# Patient Record
Sex: Female | Born: 1965 | Race: Black or African American | Hispanic: No | Marital: Married | Smoking: Never smoker
Health system: Southern US, Community
[De-identification: ages and names within clinical notes are randomized; demographics above are authoritative.]

## PROBLEM LIST (undated history)

## (undated) DIAGNOSIS — R011 Cardiac murmur, unspecified: Secondary | ICD-10-CM

## (undated) DIAGNOSIS — N189 Chronic kidney disease, unspecified: Secondary | ICD-10-CM

## (undated) DIAGNOSIS — K5790 Diverticulosis of intestine, part unspecified, without perforation or abscess without bleeding: Secondary | ICD-10-CM

## (undated) DIAGNOSIS — Z800 Family history of malignant neoplasm of digestive organs: Secondary | ICD-10-CM

## (undated) DIAGNOSIS — E119 Type 2 diabetes mellitus without complications: Secondary | ICD-10-CM

## (undated) DIAGNOSIS — G43909 Migraine, unspecified, not intractable, without status migrainosus: Secondary | ICD-10-CM

## (undated) DIAGNOSIS — D649 Anemia, unspecified: Secondary | ICD-10-CM

## (undated) DIAGNOSIS — K2980 Duodenitis without bleeding: Secondary | ICD-10-CM

## (undated) DIAGNOSIS — I10 Essential (primary) hypertension: Secondary | ICD-10-CM

## (undated) DIAGNOSIS — N200 Calculus of kidney: Secondary | ICD-10-CM

## (undated) HISTORY — DX: Essential (primary) hypertension: I10

## (undated) HISTORY — DX: Diverticulosis of intestine, part unspecified, without perforation or abscess without bleeding: K57.90

## (undated) HISTORY — PX: TUBAL LIGATION: SHX77

## (undated) HISTORY — DX: Calculus of kidney: N20.0

## (undated) HISTORY — DX: Family history of malignant neoplasm of digestive organs: Z80.0

## (undated) HISTORY — DX: Anemia, unspecified: D64.9

## (undated) HISTORY — PX: KNEE ARTHROSCOPY W/ SYNOVECTOMY: SHX1887

## (undated) HISTORY — DX: Duodenitis without bleeding: K29.80

## (undated) HISTORY — PX: CHOLECYSTECTOMY: SHX55

---

## 1989-05-17 DIAGNOSIS — O24419 Gestational diabetes mellitus in pregnancy, unspecified control: Secondary | ICD-10-CM

## 2004-10-03 ENCOUNTER — Ambulatory Visit: Payer: Self-pay | Admitting: Orthopaedic Surgery

## 2006-05-20 ENCOUNTER — Ambulatory Visit: Payer: Self-pay | Admitting: Gastroenterology

## 2007-10-20 HISTORY — PX: LAPAROSCOPIC SUPRACERVICAL HYSTERECTOMY: SUR797

## 2008-07-04 ENCOUNTER — Ambulatory Visit: Payer: Self-pay | Admitting: Unknown Physician Specialty

## 2008-07-05 ENCOUNTER — Ambulatory Visit: Payer: Self-pay | Admitting: Unknown Physician Specialty

## 2012-10-31 ENCOUNTER — Other Ambulatory Visit: Payer: Self-pay | Admitting: Family Medicine

## 2012-10-31 LAB — CBC WITH DIFFERENTIAL/PLATELET
Basophil #: 0 10*3/uL (ref 0.0–0.1)
Eosinophil #: 0.1 10*3/uL (ref 0.0–0.7)
HCT: 40.5 % (ref 35.0–47.0)
HGB: 13.4 g/dL (ref 12.0–16.0)
Lymphocyte %: 45 %
MCH: 29.7 pg (ref 26.0–34.0)
MCHC: 33.2 g/dL (ref 32.0–36.0)
MCV: 90 fL (ref 80–100)
Monocyte %: 9.3 %
Neutrophil %: 42.2 %
RDW: 13.3 % (ref 11.5–14.5)
WBC: 4.4 10*3/uL (ref 3.6–11.0)

## 2012-10-31 LAB — COMPREHENSIVE METABOLIC PANEL
Albumin: 4.3 g/dL (ref 3.4–5.0)
Alkaline Phosphatase: 84 U/L (ref 50–136)
BUN: 12 mg/dL (ref 7–18)
EGFR (African American): >60
EGFR (Non-African Amer.): >60
Glucose: 122 mg/dL — ABNORMAL HIGH (ref 65–99)
Osmolality: 286 (ref 275–301)
Potassium: 3.8 mmol/L (ref 3.5–5.1)
SGOT(AST): 14 U/L — ABNORMAL LOW (ref 15–37)
SGPT (ALT): 17 U/L (ref 12–78)
Sodium: 143 mmol/L (ref 136–145)
Total Protein: 7.8 g/dL (ref 6.4–8.2)

## 2012-10-31 LAB — TSH: Thyroid Stimulating Horm: 1.43 u[IU]/mL

## 2012-10-31 LAB — LIPID PANEL
Cholesterol: 158 mg/dL (ref 0–200)
Ldl Cholesterol, Calc: 85 mg/dL (ref 0–100)
VLDL Cholesterol, Calc: 10 mg/dL (ref 5–40)

## 2013-08-29 ENCOUNTER — Ambulatory Visit: Payer: Self-pay | Admitting: Family Medicine

## 2014-03-19 LAB — BASIC METABOLIC PANEL
BUN: 11 mg/dL (ref 4–21)
CREATININE: 0.8 mg/dL (ref 0.5–1.1)
Glucose: 147 mg/dL
Potassium: 4.6 mmol/L (ref 3.4–5.3)
Sodium: 144 mmol/L (ref 137–147)

## 2014-03-19 LAB — HEPATIC FUNCTION PANEL
ALT: 12 U/L (ref 7–35)
AST: 11 U/L — AB (ref 13–35)
Alkaline Phosphatase: 71 U/L (ref 25–125)

## 2014-03-19 LAB — LIPID PANEL
Cholesterol: 194 mg/dL (ref 0–200)
HDL: 61 mg/dL (ref 35–70)
LDL CALC: 121 mg/dL
LDl/HDL Ratio: 2
Triglycerides: 60 mg/dL (ref 40–160)

## 2014-03-19 LAB — TSH: TSH: 1.04 u[IU]/mL (ref 0.41–5.90)

## 2014-03-19 LAB — CBC AND DIFFERENTIAL
HCT: 43 % (ref 36–46)
Hemoglobin: 14.9 g/dL (ref 12.0–16.0)
Neutrophils Absolute: 2 /uL
PLATELETS: 223 10*3/uL (ref 150–399)
WBC: 4.1 10*3/mL

## 2014-03-19 LAB — HEMOGLOBIN A1C: HEMOGLOBIN A1C: 7.9 % — AB (ref 4.0–6.0)

## 2015-03-25 DIAGNOSIS — E119 Type 2 diabetes mellitus without complications: Secondary | ICD-10-CM | POA: Insufficient documentation

## 2015-04-12 ENCOUNTER — Encounter: Payer: Self-pay | Admitting: *Deleted

## 2015-04-15 ENCOUNTER — Encounter
Admission: RE | Disposition: A | Discharge status: Home / Self Care | Payer: Self-pay | Source: Ambulatory Visit | Attending: Gastroenterology

## 2015-04-15 ENCOUNTER — Ambulatory Visit: Payer: Managed Care, Other (non HMO) | Admitting: Anesthesiology

## 2015-04-15 ENCOUNTER — Ambulatory Visit
Admission: RE | Admit: 2015-04-15 | Discharge: 2015-04-15 | Disposition: A | Discharge status: Home / Self Care | Payer: Managed Care, Other (non HMO) | Source: Ambulatory Visit | Attending: Gastroenterology | Admitting: Gastroenterology

## 2015-04-15 ENCOUNTER — Encounter: Payer: Self-pay | Admitting: *Deleted

## 2015-04-15 DIAGNOSIS — K5730 Diverticulosis of large intestine without perforation or abscess without bleeding: Secondary | ICD-10-CM | POA: Diagnosis not present

## 2015-04-15 DIAGNOSIS — Z880 Allergy status to penicillin: Secondary | ICD-10-CM | POA: Insufficient documentation

## 2015-04-15 DIAGNOSIS — Z79899 Other long term (current) drug therapy: Secondary | ICD-10-CM | POA: Insufficient documentation

## 2015-04-15 DIAGNOSIS — E119 Type 2 diabetes mellitus without complications: Secondary | ICD-10-CM | POA: Diagnosis not present

## 2015-04-15 DIAGNOSIS — R1013 Epigastric pain: Secondary | ICD-10-CM | POA: Insufficient documentation

## 2015-04-15 DIAGNOSIS — R1030 Lower abdominal pain, unspecified: Secondary | ICD-10-CM | POA: Diagnosis not present

## 2015-04-15 DIAGNOSIS — R109 Unspecified abdominal pain: Secondary | ICD-10-CM | POA: Diagnosis present

## 2015-04-15 DIAGNOSIS — N189 Chronic kidney disease, unspecified: Secondary | ICD-10-CM | POA: Insufficient documentation

## 2015-04-15 DIAGNOSIS — Z791 Long term (current) use of non-steroidal anti-inflammatories (NSAID): Secondary | ICD-10-CM | POA: Insufficient documentation

## 2015-04-15 DIAGNOSIS — Z882 Allergy status to sulfonamides status: Secondary | ICD-10-CM | POA: Diagnosis not present

## 2015-04-15 DIAGNOSIS — K2980 Duodenitis without bleeding: Secondary | ICD-10-CM | POA: Insufficient documentation

## 2015-04-15 HISTORY — DX: Migraine, unspecified, not intractable, without status migrainosus: G43.909

## 2015-04-15 HISTORY — DX: Cardiac murmur, unspecified: R01.1

## 2015-04-15 HISTORY — PX: ESOPHAGOGASTRODUODENOSCOPY: SHX5428

## 2015-04-15 HISTORY — PX: COLONOSCOPY WITH PROPOFOL: SHX5780

## 2015-04-15 HISTORY — DX: Type 2 diabetes mellitus without complications: E11.9

## 2015-04-15 HISTORY — DX: Chronic kidney disease, unspecified: N18.9

## 2015-04-15 LAB — GLUCOSE, CAPILLARY: GLUCOSE-CAPILLARY: 124 mg/dL — AB (ref 65–99)

## 2015-04-15 SURGERY — COLONOSCOPY WITH PROPOFOL
Anesthesia: General

## 2015-04-15 MED ORDER — PROPOFOL 10 MG/ML IV BOLUS
INTRAVENOUS | Status: DC | PRN
  Administered 2015-04-15: 50 mg via INTRAVENOUS

## 2015-04-15 MED ORDER — MIDAZOLAM HCL 5 MG/5ML IJ SOLN
INTRAMUSCULAR | Status: DC | PRN
  Administered 2015-04-15: 1 mg via INTRAVENOUS

## 2015-04-15 MED ORDER — SODIUM CHLORIDE 0.9 % IV SOLN
INTRAVENOUS | Status: DC | PRN
  Administered 2015-04-15: 11:00:00 via INTRAVENOUS

## 2015-04-15 MED ORDER — LIDOCAINE HCL (CARDIAC) 20 MG/ML IV SOLN
INTRAVENOUS | Status: DC | PRN
  Administered 2015-04-15: 30 mg via INTRAVENOUS

## 2015-04-15 MED ORDER — SODIUM CHLORIDE 0.9 % IV SOLN
INTRAVENOUS | Status: DC
  Administered 2015-04-15: 11:00:00 via INTRAVENOUS

## 2015-04-15 MED ORDER — FENTANYL CITRATE (PF) 100 MCG/2ML IJ SOLN
INTRAMUSCULAR | Status: DC | PRN
  Administered 2015-04-15: 50 ug via INTRAVENOUS

## 2015-04-15 MED ORDER — PROPOFOL INFUSION 10 MG/ML OPTIME
INTRAVENOUS | Status: DC | PRN
  Administered 2015-04-15: 140 ug/kg/min via INTRAVENOUS

## 2015-04-15 NOTE — Op Note (Signed)
[facility] Gastroenterology Patient Name: Jacqueline Romero Procedure Date: 04/15/2015 11:03 AM MRN: [id number] Account #: [id number] Date of Birth: January 06, 1966 Admit Type: Outpatient Age: 49 Room: [facility] ENDO ROOM 2 Gender: Female Note Status: Finalized Procedure:         Upper GI endoscopy Indications:       Dyspepsia Providers:         Christena Deem, MD Referring MD:      Ferdinand Lango. Sullivan Lone, MD (Referring MD) Medicines:         Monitored Anesthesia Care Complications:     No immediate complications. Procedure:         Pre-Anesthesia Assessment:                    - ASA Grade Assessment: II - A patient with mild systemic                     disease.                    After obtaining informed consent, the endoscope was passed                     under direct vision. Throughout the procedure, the                     patient's blood pressure, pulse, and oxygen saturations                     were monitored continuously. The Olympus GIF-160 endoscope                     (S#. [id number]) was introduced through the mouth, and                     advanced to the third part of duodenum. The upper GI                     endoscopy was accomplished without difficulty. Findings:      The Z-line was variable. Biopsies were taken with a cold forceps for       histology.      The exam of the esophagus was otherwise normal.      The entire examined stomach was normal.      The cardia and gastric fundus were normal on retroflexion.      Patchy mild inflammation characterized by erythema and granularity was       found in the duodenal bulb. 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 Impression:        - Z-line variable. Biopsied.                    - Normal stomach.                    - Duodenitis. Recommendation:    - Use Protonix (pantoprazole) 40 mg PO daily daily. Procedure Code(s): --- Professional ---                    [phone],  Esophagogastroduodenoscopy, flexible, transoral;                     with biopsy, single or multiple Diagnosis Code(s): --- Professional ---  530.89, Other specified disorders of esophagus                    535.60, Duodenitis, without mention of hemorrhage                    536.8, Dyspepsia and other specified disorders of function                     of stomach CPT copyright 2014 American [employer] Association. All rights reserved. The codes documented in this report are preliminary and upon coder review may  be revised to meet current compliance requirements. Christena DeemMartin U Keasia Dubose, MD 04/15/2015 11:48:27 AM This report has been signed electronically. Number of Addenda: 0 Note Initiated On: 04/15/2015 11:03 AM      [facility]

## 2015-04-15 NOTE — Op Note (Signed)
[facility] Gastroenterology Patient Name: Jacqueline Romero Procedure Date: 04/15/2015 11:02 AM MRN: [id number] Account #: [id number] Date of Birth: 1966-07-13 Admit Type: Outpatient Age: 49 Room: [facility] ENDO ROOM 2 Gender: Female Note Status: Finalized Procedure:         Colonoscopy Indications:       Lower abdominal pain Providers:         Christena Deem, MD Referring MD:      Ferdinand Lango. Sullivan Lone, MD (Referring MD) Medicines:         Monitored Anesthesia Care Complications:     No immediate complications. Procedure:         Pre-Anesthesia Assessment:                    - ASA Grade Assessment: II - A patient with mild systemic                     disease.                    After obtaining informed consent, the colonoscope was                     passed under direct vision. Throughout the procedure, the                     patient's blood pressure, pulse, and oxygen saturations                     were monitored continuously. The Olympus PCF-160AL                     colonoscope (S#. [id number]) was introduced through the anus                     and advanced to the the cecum, identified by appendiceal                     orifice and ileocecal valve. The colonoscopy was performed                     without difficulty. The patient tolerated the procedure                     well. The quality of the bowel preparation was good. Findings:      A few small-mouthed diverticula were found in the sigmoid colon.      The retroflexed view of the distal rectum and anal verge was normal and       showed no anal or rectal abnormalities.      The digital rectal exam was normal. Impression:        - Diverticulosis in the sigmoid colon.                    - The distal rectum and anal verge are normal on                     retroflexion view.                    - No specimens collected. Recommendation:    - Continue present medications. Procedure Code(s): --- Professional  ---                    [phone], Colonoscopy, flexible; diagnostic, including  collection of specimen(s) by brushing or washing, when                     performed (separate procedure) Diagnosis Code(s): --- Professional ---                    789.09, Abdominal pain, other specified site                    562.10, Diverticulosis of colon (without mention of                     hemorrhage) CPT copyright 2014 American Medical Association. All rights reserved. The codes documented in this report are preliminary and upon coder review may  be revised to meet current compliance requirements. Christena Deem, MD 04/15/2015 12:04:40 PM This report has been signed electronically. Number of Addenda: 0 Note Initiated On: 04/15/2015 11:02 AM Scope Withdrawal Time: 0 hours 0 minutes 1 second  Total Procedure Duration: 0 hours 4 minutes 41 seconds       [facility]

## 2015-04-15 NOTE — H&P (Signed)
Outpatient short stay form Pre-procedure 04/15/2015 11:15 AM Christena Deem MD  Primary Physician: Dr. Julieanne Manson  Reason for visit:  Abdominal pain and dyspepsia  History of present illness:  Patient is a 49 year old female presenting with complaint of some epigastric abdominal pain. This also seems to be periumbilical at times and toward the lower abdomen. It is increased with eating and having a bowel movement. He does take meloxicam at bedtime without food. She occasionally will take a powdered aspirin product. She was only recently started on 20 mg of omeprazole which has been of some benefit. He tolerated her prep well. Takes no anticoagulates.    Current facility-administered medications:  .  0.9 %  sodium chloride infusion, , Intravenous, Continuous, Christena Deem, MD, Last Rate: 20 mL/hr at 04/15/15 1113  Facility-Administered Medications Ordered in Other Encounters:  .  0.9 %  sodium chloride infusion, , Intravenous, Continuous PRN, Charna Busman, CRNA  Prescriptions prior to admission  Medication Sig Dispense Refill Last Dose  . Cholecalciferol (VITAMIN D) 2000 UNITS CAPS Take 2,000 Units by mouth daily.     . ferrous fumarate (HEMOCYTE - 106 MG FE) 325 (106 FE) MG TABS tablet Take 1 tablet by mouth daily.   Past Week at Unknown time  . losartan (COZAAR) 25 MG tablet Take 25 mg by mouth daily.     . meloxicam (MOBIC) 15 MG tablet Take 15 mg by mouth daily.     . metFORMIN (GLUCOPHAGE) 1000 MG tablet Take 1,000 mg by mouth 2 (two) times daily with a meal.     . omeprazole (PRILOSEC OTC) 20 MG tablet Take 20 mg by mouth daily.     . pravastatin (PRAVACHOL) 10 MG tablet Take 10 mg by mouth daily.        Allergies  Allergen Reactions  . Penicillins Hives and Swelling  . Sulfur Hives     Past Medical History  Diagnosis Date  . Diabetes mellitus without complication   . Migraine   . Heart murmur   . Chronic kidney disease     Review of systems:       Physical Exam    Heart and lungs: Regular rate and rhythm without rub or gallop lungs are bilaterally clear    HEENT: Normocephalic atraumatic eyes are anicteric    Other:     Pertinant exam for procedure: Soft mild tenderness to palpation the epigastric region extending toward the umbilicus. There are no masses rebound or organomegaly. Bowel sounds positive normoactive. She is not distended.    Planned proceedures: EGD and colonoscopy. I have discussed the risks benefits and complications of procedures to include not limited to bleeding, infection, perforation and the risk of sedation and the patient wishes to proceed.    Christena Deem, MD Gastroenterology 04/15/2015  11:15 AM

## 2015-04-15 NOTE — Anesthesia Preprocedure Evaluation (Signed)
Anesthesia Evaluation  Patient identified by MRN, date of birth, ID band Patient awake    Reviewed: Allergy & Precautions, H&P , NPO status , Patient's Chart, lab work & pertinent test results, reviewed documented beta blocker date and time   Airway Mallampati: II  TM Distance: >3 FB Neck ROM: full    Dental no notable dental hx.    Pulmonary neg pulmonary ROS,  breath sounds clear to auscultation  Pulmonary exam normal       Cardiovascular Exercise Tolerance: Good negative cardio ROS  + Valvular Problems/Murmurs Rhythm:regular Rate:Normal     Neuro/Psych  Headaches, negative neurological ROS  negative psych ROS   GI/Hepatic negative GI ROS, Neg liver ROS,   Endo/Other  negative endocrine ROSdiabetes  Renal/GU Renal diseasenegative Renal ROS  negative genitourinary   Musculoskeletal   Abdominal   Peds  Hematology negative hematology ROS (+)   Anesthesia Other Findings   Reproductive/Obstetrics negative OB ROS                             Anesthesia Physical Anesthesia Plan  ASA: II  Anesthesia Plan: General   Post-op Pain Management:    Induction:   Airway Management Planned:   Additional Equipment:   Intra-op Plan:   Post-operative Plan:   Informed Consent: I have reviewed the patients History and Physical, chart, labs and discussed the procedure including the risks, benefits and alternatives for the proposed anesthesia with the patient or authorized representative who has indicated his/her understanding and acceptance.   Dental Advisory Given  Plan Discussed with: CRNA  Anesthesia Plan Comments:         Anesthesia Quick Evaluation

## 2015-04-15 NOTE — Transfer of Care (Signed)
Immediate Anesthesia Transfer of Care Note  Patient: Jacqueline Romero T Bolding  Procedure(s) Performed: Procedure(s): COLONOSCOPY WITH PROPOFOL (N/A) ESOPHAGOGASTRODUODENOSCOPY (EGD) (N/A)  Patient Location: PACU, Short Stay and Endoscopy Unit  Anesthesia Type:General  Level of Consciousness: awake and patient cooperative  Airway & Oxygen Therapy: Patient Spontanous Breathing and Patient connected to nasal cannula oxygen  Post-op Assessment: Report given to RN and Post -op Vital signs reviewed and stable  Post vital signs: Reviewed and stable  Last Vitals:  Filed Vitals:   04/15/15 1210  BP:   Pulse:   Temp: 36.1 C  Resp:     Complications: No apparent anesthesia complications

## 2015-04-16 LAB — SURGICAL PATHOLOGY

## 2015-04-16 NOTE — Anesthesia Postprocedure Evaluation (Signed)
  Anesthesia Post-op Note  Patient: Jacqueline Romero  Procedure(s) Performed: Procedure(s): COLONOSCOPY WITH PROPOFOL (N/A) ESOPHAGOGASTRODUODENOSCOPY (EGD) (N/A)  Anesthesia type:General  Patient location: PACU  Post pain: Pain level controlled  Post assessment: Post-op Vital signs reviewed, Patient's Cardiovascular Status Stable, Respiratory Function Stable, Patent Airway and No signs of Nausea or vomiting  Post vital signs: Reviewed and stable  Last Vitals:  Filed Vitals:   04/15/15 1240  BP: 133/77  Pulse:   Temp:   Resp: 14    Level of consciousness: awake, alert  and patient cooperative  Complications: No apparent anesthesia complications

## 2015-04-21 ENCOUNTER — Other Ambulatory Visit: Payer: Self-pay | Admitting: Family Medicine

## 2015-06-19 DIAGNOSIS — E559 Vitamin D deficiency, unspecified: Secondary | ICD-10-CM | POA: Insufficient documentation

## 2015-06-19 DIAGNOSIS — E1169 Type 2 diabetes mellitus with other specified complication: Secondary | ICD-10-CM | POA: Insufficient documentation

## 2015-06-19 DIAGNOSIS — K219 Gastro-esophageal reflux disease without esophagitis: Secondary | ICD-10-CM | POA: Insufficient documentation

## 2015-06-19 DIAGNOSIS — E669 Obesity, unspecified: Secondary | ICD-10-CM

## 2015-06-19 DIAGNOSIS — E785 Hyperlipidemia, unspecified: Secondary | ICD-10-CM | POA: Insufficient documentation

## 2015-06-20 ENCOUNTER — Encounter: Payer: Self-pay | Admitting: Family Medicine

## 2015-07-04 ENCOUNTER — Ambulatory Visit (INDEPENDENT_AMBULATORY_CARE_PROVIDER_SITE_OTHER): Payer: Managed Care, Other (non HMO) | Admitting: Family Medicine

## 2015-07-04 ENCOUNTER — Encounter: Payer: Self-pay | Admitting: Family Medicine

## 2015-07-04 VITALS — BP 118/78 | HR 72 | Temp 98.3°F | Resp 16 | Ht 65.0 in | Wt 178.2 lb

## 2015-07-04 DIAGNOSIS — M25511 Pain in right shoulder: Secondary | ICD-10-CM | POA: Diagnosis not present

## 2015-07-04 DIAGNOSIS — Z0000 Encounter for general adult medical examination without abnormal findings: Secondary | ICD-10-CM | POA: Diagnosis not present

## 2015-07-04 DIAGNOSIS — E119 Type 2 diabetes mellitus without complications: Secondary | ICD-10-CM

## 2015-07-04 NOTE — Progress Notes (Signed)
Patient ID: Jacqueline Romero, female   DOB: 08/10/1966, 49 y.o.   MRN: [id number]       Patient: Jacqueline Romero, Female    DOB: [birth year], 49 y.o.   MRN: [id number] Visit Date: 07/04/2015  Today's Provider: [name], MD   Chief Complaint  Patient presents with  . Annual Exam    annual exam a year ago.   Subjective:    Annual physical exam [name] is a 49 y.o. female who presents today for health maintenance and complete physical. She feels well. She reports exercising walk twice a week.. She reports she is sleeping fairly well.  Pap - Dr. Harvest Dark Mammo 03/2015 Dr. Harvest Dark normal per pt Tdap 01/136/2014 EKG 01/18/2007 Colon with Endo 06/27/206 Diverticulosis otherwise normal - Dr. Marva Panda Eye exam 02/2015  -----------------------------------------------------------------   Review of Systems  Constitutional: Negative.   HENT: Positive for sore throat.   Eyes: Negative.   Respiratory: Negative.   Cardiovascular: Negative.   Gastrointestinal: Positive for abdominal pain and constipation.  Endocrine: Negative.   Genitourinary: Negative.   Musculoskeletal: Negative.   Allergic/Immunologic: Negative.   Neurological: Negative.   Hematological: Negative.   Psychiatric/Behavioral: Negative.     Social History She  reports that she has never smoked. She does not have any smokeless tobacco history on file. She reports that she does not drink alcohol or use illicit drugs. Social History   Social History  . Marital Status: Married    Spouse Name: N/A  . Number of Children: N/A  . Years of Education: N/A   Social History Main Topics  . Smoking status: Never Smoker   . Smokeless tobacco: None  . Alcohol Use: No  . Drug Use: No  . Sexual Activity: Not Asked   Other Topics Concern  . None   Social History Narrative    Patient Active Problem List   Diagnosis Date Noted  . Vitamin D deficiency 06/19/2015  . Obesity 06/19/2015  .  Hyperlipidemia 06/19/2015  . GERD (gastroesophageal reflux disease) 06/19/2015  . Diabetes mellitus, type 2 03/25/2015    Past Surgical History  Procedure Laterality Date  . Cholecystectomy    . Tubal ligation Bilateral   . Knee arthroscopy w/ synovectomy Left   . Esophagogastroduodenoscopy    . Colonoscopy with propofol N/A 04/15/2015    Procedure: COLONOSCOPY WITH PROPOFOL;  Surgeon: Christena Deem, MD;  Location: [facility] ENDOSCOPY;  Service: Endoscopy;  Laterality: N/A;  . Esophagogastroduodenoscopy N/A 04/15/2015    Procedure: ESOPHAGOGASTRODUODENOSCOPY (EGD);  Surgeon: Christena Deem, MD;  Location: [facility] ENDOSCOPY;  Service: Endoscopy;  Laterality: N/A;  . Abdominal hysterectomy      Family History  Family Status  Relation Status Death Age  . Mother Deceased 75  . Father Deceased 70  . Sister Alive   . Brother Alive    Her family history includes Diabetes in her maternal aunt, maternal uncle, and sister; Hypertension in her brother, father, maternal aunt, maternal uncle, and sister; Pancreatic cancer in her mother; Throat cancer in her father.    Allergies  Allergen Reactions  . Penicillins Hives and Swelling  . Sulfur Hives    Previous Medications   CHOLECALCIFEROL (VITAMIN D) 2000 UNITS CAPS    Take 2,000 Units by mouth daily.   FERROUS FUMARATE (HEMOCYTE - 106 MG FE) 325 (106 FE) MG TABS TABLET    Take 1 tablet by mouth daily.   LOSARTAN (COZAAR) 25 MG TABLET    Take 25 mg  by mouth daily.   MELOXICAM (MOBIC) 15 MG TABLET    Take 15 mg by mouth daily.   METFORMIN (GLUCOPHAGE) 1000 MG TABLET    TAKE ONE TABLET BY MOUTH TWICE DAILY   OMEPRAZOLE (PRILOSEC OTC) 20 MG TABLET    Take 20 mg by mouth daily.   PANTOPRAZOLE (PROTONIX) 20 MG TABLET    Take 20 mg by mouth 2 (two) times daily.   PRAVASTATIN (PRAVACHOL) 10 MG TABLET    Take 10 mg by mouth daily.   PSYLLIUM (METAMUCIL) 48.57 % POWD    Take by mouth.    Patient Care Team: Maple Hudson., MD as PCP -  General (Family Medicine)     Objective:   Vitals: BP 118/78 mmHg  Pulse 72  Temp(Src) 98.3 F (36.8 C) (Oral)  Resp 16  Ht  (1.651 m)  Wt 178 lb 3.2 oz (80.831 kg)  BMI 29.65 kg/m2   Physical Exam  Constitutional: She is oriented to person, place, and time. She appears well-developed and well-nourished.  HENT:  Head: Normocephalic and atraumatic.  Right Ear: External ear normal.  Left Ear: External ear normal.  Nose: Nose normal.  Eyes: Conjunctivae and EOM are normal. Pupils are equal, round, and reactive to light.  Neck: Neck supple.  Cardiovascular: Normal rate, regular rhythm and normal heart sounds.   Pulmonary/Chest: Effort normal and breath sounds normal.  Abdominal: Soft.  Musculoskeletal: Normal range of motion.  Neurological: She is alert and oriented to person, place, and time.  Skin: Skin is warm and dry.  Psychiatric: She has a normal mood and affect. Her behavior is normal. Judgment and thought content normal.     Depression Screen No flowsheet data found.    Assessment & Plan:     Routine Health Maintenance and Physical Exam  Exercise Activities and Dietary recommendations Goals    None      Immunization History  Administered Date(s) Administered  . Hepatitis B 05/25/2013, 06/30/2013  . Td 07/27/1997, 10/31/2012    Health Maintenance  Topic Date Due  . PNEUMOCOCCAL POLYSACCHARIDE VACCINE (1) 09/30/1967  . FOOT EXAM  09/30/1975  . OPHTHALMOLOGY EXAM  09/30/1975  . URINE MICROALBUMIN  09/30/1975  . HIV Screening  09/29/1980  . PAP SMEAR  09/29/1986  . HEMOGLOBIN A1C  09/18/2014  . INFLUENZA VACCINE  05/20/2015  . TETANUS/TDAP  10/31/2022      Discussed health benefits of physical activity, and encouraged her to engage in regular exercise appropriate for her age and condition. Regular diet and exercise discussed at length.  gynecologic care per Dr. Arvil Chaco. --------------------------------------------------------------------

## 2015-07-11 ENCOUNTER — Encounter: Payer: Self-pay | Admitting: Family Medicine

## 2015-10-24 ENCOUNTER — Other Ambulatory Visit: Payer: Self-pay

## 2016-01-03 ENCOUNTER — Other Ambulatory Visit: Payer: Self-pay | Admitting: Family Medicine

## 2016-03-18 ENCOUNTER — Encounter: Payer: Self-pay | Admitting: Family Medicine

## 2016-04-30 ENCOUNTER — Other Ambulatory Visit: Payer: Self-pay | Admitting: Family Medicine

## 2016-06-10 ENCOUNTER — Telehealth: Payer: Self-pay | Admitting: Family Medicine

## 2016-06-10 NOTE — Telephone Encounter (Signed)
Pt was scheduled for CPE on 06/30/16. When pt was contacted to reschedule appt the next available was 07/21/16 and pt stated she needs the appt before October because her work is requiring the appt prior to end of September. Can the pt be worked in for a CPE before October? Last CPE was 07/04/15. Please advise. Thanks TNP

## 2016-06-10 NOTE — Telephone Encounter (Signed)
There are some 3 pm appointments in September do you want to use those. Let me know=aa

## 2016-06-10 NOTE — Telephone Encounter (Signed)
I think Thursday, August 31, has some openings in the morning.

## 2016-06-10 NOTE — Telephone Encounter (Signed)
Her last one was 07/04/15 so she has to have it after that date but before October -aa

## 2016-06-11 NOTE — Telephone Encounter (Signed)
Spoke with pt and scheduled appt for 07/09/16 at 3pm. Pt was pleased to be worked in. Thanks TNP

## 2016-06-11 NOTE — Telephone Encounter (Signed)
Since we changed it because of my change in schedule how about 07/09/16  at 3:00pm?

## 2016-06-11 NOTE — Telephone Encounter (Signed)
Please schedule patient on that day as staettes per Dr Sullivan LoneGilbert, I advised patient on her voicemail, I am not able to add her at that time. Thank you-aa

## 2016-06-30 ENCOUNTER — Encounter: Payer: Managed Care, Other (non HMO) | Admitting: Family Medicine

## 2016-07-09 ENCOUNTER — Ambulatory Visit (INDEPENDENT_AMBULATORY_CARE_PROVIDER_SITE_OTHER): Payer: Managed Care, Other (non HMO) | Admitting: Family Medicine

## 2016-07-09 ENCOUNTER — Encounter: Payer: Self-pay | Admitting: Family Medicine

## 2016-07-09 VITALS — BP 122/72 | HR 72 | Temp 98.0°F | Resp 16 | Ht 66.0 in | Wt 175.0 lb

## 2016-07-09 DIAGNOSIS — E785 Hyperlipidemia, unspecified: Secondary | ICD-10-CM | POA: Diagnosis not present

## 2016-07-09 DIAGNOSIS — K219 Gastro-esophageal reflux disease without esophagitis: Secondary | ICD-10-CM

## 2016-07-09 DIAGNOSIS — E119 Type 2 diabetes mellitus without complications: Secondary | ICD-10-CM | POA: Diagnosis not present

## 2016-07-09 DIAGNOSIS — Z126 Encounter for screening for malignant neoplasm of bladder: Secondary | ICD-10-CM

## 2016-07-09 DIAGNOSIS — Z0000 Encounter for general adult medical examination without abnormal findings: Secondary | ICD-10-CM

## 2016-07-09 LAB — POCT URINALYSIS DIPSTICK
Bilirubin, UA: NEGATIVE
GLUCOSE UA: NEGATIVE
Leukocytes, UA: NEGATIVE
NITRITE UA: NEGATIVE
PH UA: 5
PROTEIN UA: NEGATIVE
RBC UA: NEGATIVE
Spec Grav, UA: >=1.03
UROBILINOGEN UA: 0.2

## 2016-07-09 MED ORDER — OMEPRAZOLE 20 MG PO CPDR: 20.0000 mg | DELAYED_RELEASE_CAPSULE | Freq: Every day | ORAL | 12 refills | Status: DC

## 2016-07-09 NOTE — Progress Notes (Signed)
Patient: Jacqueline Romero, Female    DOB: September 12, 1966, 50 y.o.   MRN: [id number] Visit Date: 07/09/2016  Today's Provider: [name], MD   Chief Complaint  Patient presents with  . Annual Exam   Subjective:    Annual physical exam [name] is a 50 y.o. female who presents today for health maintenance and complete physical. She feels well. She reports exercising 7 days a week; walks for about 15 minutes. She reports she is sleeping well.  ----------------------------------------------------------------- Sees Gynecology Last colonoscopy- 04/15/2015- Diverticulosis. Otherwise normal.  Review of Systems  HENT: Positive for sore throat.   Eyes: Negative.   Respiratory: Negative.   Cardiovascular: Negative.   Gastrointestinal: Positive for abdominal pain.  Endocrine: Negative.   Genitourinary: Negative.   Musculoskeletal: Negative.   Allergic/Immunologic: Negative.   Neurological: Negative.   Psychiatric/Behavioral: Negative.   All other systems reviewed and are negative.   Social History      She  reports that she has never smoked. She has never used smokeless tobacco. She reports that she does not drink alcohol or use drugs.       Social History   Social History  . Marital status: Married    Spouse name: Reuel Boom  . Number of children: 1  . Years of education: masters   Occupational History  .  [employer]   Social History Main Topics  . Smoking status: Never Smoker  . Smokeless tobacco: Never Used  . Alcohol use No  . Drug use: No  . Sexual activity: Not Asked   Other Topics Concern  . None   Social History Narrative  . None    Past Medical History:  Diagnosis Date  . Chronic kidney disease   . Diabetes mellitus without complication (HCC)   . Heart murmur   . Migraine      Patient Active Problem List   Diagnosis Date Noted  . Vitamin D deficiency 06/19/2015  . Obesity 06/19/2015  . Hyperlipidemia 06/19/2015  .  GERD (gastroesophageal reflux disease) 06/19/2015  . Diabetes mellitus, type 2 (HCC) 03/25/2015    Past Surgical History:  Procedure Laterality Date  . ABDOMINAL HYSTERECTOMY    . CHOLECYSTECTOMY    . COLONOSCOPY WITH PROPOFOL N/A 04/15/2015   Procedure: COLONOSCOPY WITH PROPOFOL;  Surgeon: Christena Deem, MD;  Location: [facility] ENDOSCOPY;  Service: Endoscopy;  Laterality: N/A;  . ESOPHAGOGASTRODUODENOSCOPY    . ESOPHAGOGASTRODUODENOSCOPY N/A 04/15/2015   Procedure: ESOPHAGOGASTRODUODENOSCOPY (EGD);  Surgeon: Christena Deem, MD;  Location: [facility] ENDOSCOPY;  Service: Endoscopy;  Laterality: N/A;  . KNEE ARTHROSCOPY W/ SYNOVECTOMY Left   . TUBAL LIGATION Bilateral     Family History        Family Status  Relation Status  . Mother Deceased at age 44  . Father Deceased at age 77  . Sister Alive  . Brother Alive  . Maternal Aunt   . Maternal Uncle         Her family history includes Diabetes in her maternal aunt, maternal uncle, and sister; Hypertension in her brother, father, maternal aunt, maternal uncle, and sister; Pancreatic cancer in her mother; Throat cancer in her father.    Allergies  Allergen Reactions  . Penicillins Hives and Swelling  . Sulfur Hives    Current Meds  Medication Sig  . Cholecalciferol (VITAMIN D) 2000 UNITS CAPS Take 2,000 Units by mouth daily.  . ferrous fumarate (HEMOCYTE - 106 MG FE) 325 (106  FE) MG TABS tablet Take 1 tablet by mouth daily.  Marland Kitchen losartan (COZAAR) 25 MG tablet TAKE ONE TABLET BY MOUTH ONCE DAILY  . metFORMIN (GLUCOPHAGE) 1000 MG tablet TAKE ONE TABLET BY MOUTH TWICE DAILY  . Psyllium (METAMUCIL) 48.57 % POWD Take by mouth.    Patient Care Team: Maple Hudson., MD as PCP - General (Family Medicine)     Objective:   Vitals: BP 122/72 (BP Location: Right Arm, Patient Position: Sitting, Cuff Size: Normal)   Pulse 72   Temp 98 F (36.7 C) (Oral)   Resp 16   Ht 5\' 6"  (1.676 m)   Wt 175 lb (79.4 kg)   BMI 28.25 kg/m     Physical Exam  Constitutional: She is oriented to person, place, and time. She appears well-developed and well-nourished.  HENT:  Head: Normocephalic and atraumatic.  Right Ear: Tympanic membrane, external ear and ear canal normal.  Left Ear: Tympanic membrane, external ear and ear canal normal.  Nose: Nose normal.  Mouth/Throat: Uvula is midline, oropharynx is clear and moist and mucous membranes are normal.  Eyes: Conjunctivae, EOM and lids are normal. Pupils are equal, round, and reactive to light.  Neck: Trachea normal and normal range of motion. Neck supple. Carotid bruit is not present. No thyroid mass and no thyromegaly present.  Cardiovascular: Normal rate, regular rhythm and normal heart sounds.   Pulmonary/Chest: Effort normal and breath sounds normal.  Abdominal: Soft. Normal appearance and bowel sounds are normal. There is no hepatosplenomegaly. There is no tenderness.  Musculoskeletal: Normal range of motion.  Lymphadenopathy:    She has no cervical adenopathy.    She has no axillary adenopathy.  Neurological: She is alert and oriented to person, place, and time. She has normal strength. No cranial nerve deficit.  Skin: Skin is warm, dry and intact.  Psychiatric: She has a normal mood and affect. Her speech is normal and behavior is normal. Judgment and thought content normal. Cognition and memory are normal.     Depression Screen PHQ 2/9 Scores 07/09/2016  PHQ - 2 Score 0      Assessment & Plan:     Routine Health Maintenance and Physical Exam  Exercise Activities and Dietary recommendations Goals    None      Immunization History  Administered Date(s) Administered  . Hepatitis B 05/25/2013, 06/30/2013  . Td 07/27/1997, 10/31/2012    Health Maintenance  Topic Date Due  . PNEUMOCOCCAL POLYSACCHARIDE VACCINE (1) 09/30/1967  . FOOT EXAM  09/30/1975  . OPHTHALMOLOGY EXAM  09/30/1975  . HIV Screening  09/29/1980  . PAP SMEAR  09/29/1986  . HEMOGLOBIN  A1C  09/18/2014  . INFLUENZA VACCINE  10/23/2016 (Originally 05/19/2016)  . MAMMOGRAM  02/24/2018  . TETANUS/TDAP  10/31/2022  . COLONOSCOPY  04/14/2025      Discussed health benefits of physical activity, and encouraged her to engage in regular exercise appropriate for her age and condition.    --------------------------------------------------------------------  1. Annual physical exam Stable. As above. Breasts exam, Pap, rectal exam per GYN. 2. Type 2 diabetes mellitus without complication, without long-term current use of insulin (HCC) FU pending results. - Hemoglobin A1c  3. Hyperlipidemia FU pending results. - CBC with Differential/Platelet - Comprehensive metabolic panel - Lipid panel - TSH  4. Gastroesophageal reflux disease, esophagitis presence not specified Worsening. Start Omeprazole as below. FU 2-3 month for abdominal pain. - omeprazole (PRILOSEC) 20 MG capsule; Take 1 capsule (20 mg total) by mouth daily.  Dispense: 30 capsule; Refill: 12  5. Bladder cancer screening Negative UA. Results for orders placed or performed in visit on 07/09/16  POCT urinalysis dipstick  Result Value Ref Range   Color, UA Amber    Clarity, UA Clear    Glucose, UA Negative    Bilirubin, UA Negative    Ketones, UA trace    Spec Grav, UA >=1.030    Blood, UA Negative    pH, UA 5.0    Protein, UA Negative    Urobilinogen, UA 0.2    Nitrite, UA Negative    Leukocytes, UA Negative Negative    - POCT urinalysis dipstick   Patient seen and examined by Julieanne Mansonichard Torsha Lemus, MD, and note scribed by Allene DillonEmily Drozdowski, CMA.  I have done the exam and reviewed the above chart and it is accurate to the best of my knowledge.  Kennya Schwenn Wendelyn BreslowGilbert Jr, MD  [facility]

## 2016-07-11 LAB — CBC WITH DIFFERENTIAL/PLATELET
BASOS: 1 %
Basophils Absolute: 0 10*3/uL (ref 0.0–0.2)
EOS (ABSOLUTE): 0.1 10*3/uL (ref 0.0–0.4)
Eos: 2 %
HEMATOCRIT: 41.8 % (ref 34.0–46.6)
HEMOGLOBIN: 14.3 g/dL (ref 11.1–15.9)
IMMATURE GRANULOCYTES: 0 %
Immature Grans (Abs): 0 10*3/uL (ref 0.0–0.1)
Lymphocytes Absolute: 1.9 10*3/uL (ref 0.7–3.1)
Lymphs: 48 %
MCH: 29.1 pg (ref 26.6–33.0)
MCHC: 34.2 g/dL (ref 31.5–35.7)
MCV: 85 fL (ref 79–97)
MONOCYTES: 9 %
Monocytes Absolute: 0.4 10*3/uL (ref 0.1–0.9)
NEUTROS PCT: 40 %
Neutrophils Absolute: 1.6 10*3/uL (ref 1.4–7.0)
Platelets: 233 10*3/uL (ref 150–379)
RBC: 4.91 x10E6/uL (ref 3.77–5.28)
RDW: 13.5 % (ref 12.3–15.4)
WBC: 4 10*3/uL (ref 3.4–10.8)

## 2016-07-11 LAB — TSH: TSH: 1.59 u[IU]/mL (ref 0.450–4.500)

## 2016-07-11 LAB — LIPID PANEL
CHOL/HDL RATIO: 3.1 ratio (ref 0.0–4.4)
Cholesterol, Total: 191 mg/dL (ref 100–199)
HDL: 62 mg/dL (ref >39)
LDL Calculated: 116 mg/dL — ABNORMAL HIGH (ref 0–99)
Triglycerides: 66 mg/dL (ref 0–149)
VLDL Cholesterol Cal: 13 mg/dL (ref 5–40)

## 2016-07-11 LAB — COMPREHENSIVE METABOLIC PANEL
A/G RATIO: 1.7 (ref 1.2–2.2)
ALBUMIN: 4.2 g/dL (ref 3.5–5.5)
ALT: 10 IU/L (ref 0–32)
AST: 13 IU/L (ref 0–40)
Alkaline Phosphatase: 70 IU/L (ref 39–117)
BUN / CREAT RATIO: 16 (ref 9–23)
BUN: 13 mg/dL (ref 6–24)
Bilirubin Total: 0.4 mg/dL (ref 0.0–1.2)
CALCIUM: 9.1 mg/dL (ref 8.7–10.2)
CO2: 23 mmol/L (ref 18–29)
Chloride: 103 mmol/L (ref 96–106)
Creatinine, Ser: 0.79 mg/dL (ref 0.57–1.00)
GFR calc Af Amer: 101 mL/min/{1.73_m2} (ref >59)
GFR, EST NON AFRICAN AMERICAN: 88 mL/min/{1.73_m2} (ref >59)
GLOBULIN, TOTAL: 2.5 g/dL (ref 1.5–4.5)
Glucose: 148 mg/dL — ABNORMAL HIGH (ref 65–99)
Potassium: 4.5 mmol/L (ref 3.5–5.2)
SODIUM: 143 mmol/L (ref 134–144)
Total Protein: 6.7 g/dL (ref 6.0–8.5)

## 2016-07-11 LAB — HEMOGLOBIN A1C
ESTIMATED AVERAGE GLUCOSE: 183 mg/dL
Hgb A1c MFr Bld: 8 % — ABNORMAL HIGH (ref 4.8–5.6)

## 2016-07-13 ENCOUNTER — Telehealth: Payer: Self-pay

## 2016-07-13 NOTE — Telephone Encounter (Signed)
Left message to call back  

## 2016-07-13 NOTE — Telephone Encounter (Signed)
-----   Message from Maple Hudsonichard L Gilbert Jr., MD sent at 07/11/2016  7:03 AM EDT ----- DM fair control. Guidelines suggest we might add med for DM or pt can check BS daily and follow D and E more strenuously. Consider Jardiance 10mg  q am if she feels she is doing all she can with D and E

## 2016-07-14 NOTE — Telephone Encounter (Signed)
Advised patient of results. Patient reports that she would like to work on diet and exercise. She reports that she will follow up in 3-4 months to recheck.

## 2016-07-14 NOTE — Telephone Encounter (Signed)
Send to nurse box 

## 2016-09-29 LAB — HM DIABETES EYE EXAM

## 2016-10-08 ENCOUNTER — Ambulatory Visit (INDEPENDENT_AMBULATORY_CARE_PROVIDER_SITE_OTHER): Payer: Managed Care, Other (non HMO) | Admitting: Family Medicine

## 2016-10-08 ENCOUNTER — Encounter: Payer: Self-pay | Admitting: Family Medicine

## 2016-10-08 VITALS — BP 114/68 | HR 78 | Temp 97.8°F | Resp 14 | Wt 177.0 lb

## 2016-10-08 DIAGNOSIS — J309 Allergic rhinitis, unspecified: Secondary | ICD-10-CM

## 2016-10-08 DIAGNOSIS — E119 Type 2 diabetes mellitus without complications: Secondary | ICD-10-CM | POA: Diagnosis not present

## 2016-10-08 LAB — POCT GLYCOSYLATED HEMOGLOBIN (HGB A1C): HEMOGLOBIN A1C: 7.7

## 2016-10-08 MED ORDER — FLUTICASONE PROPIONATE 50 MCG/ACT NA SUSP: 2.0000 | Freq: Every day | NASAL | 11 refills | Status: DC

## 2016-10-08 MED ORDER — DAPAGLIFLOZIN PROPANEDIOL 5 MG PO TABS: 5.0000 mg | ORAL_TABLET | Freq: Every day | ORAL | 11 refills | Status: DC

## 2016-10-08 NOTE — Patient Instructions (Signed)
Try Flonase that was called into pharmacy and try saline nasal rinses.

## 2016-10-08 NOTE — Progress Notes (Signed)
Subjective:  HPI  Diabetes Mellitus Type II, Follow-up:   Lab Results  Component Value Date   HGBA1C 8.0 (H) 07/10/2016   HGBA1C 7.9 (A) 03/19/2014    Last seen for diabetes 3 months ago.  Management since then includes none. Noted we may have to start Jardiance if A1c worse or not better than 3 months ago.  She reports good compliance with treatment. She is not having side effects.  Home blood sugar records: 130-140's   Pertinent Labs:    Component Value Date/Time   CHOL 191 07/10/2016 0908   CHOL 158 10/31/2012 1255   TRIG 66 07/10/2016 0908   TRIG 48 10/31/2012 1255   HDL 62 07/10/2016 0908   HDL 63 (H) 10/31/2012 1255   LDLCALC 116 (H) 07/10/2016 0908   LDLCALC 85 10/31/2012 1255   CREATININE 0.79 07/10/2016 0908   CREATININE 0.80 10/31/2012 1255    Wt Readings from Last 3 Encounters:  10/08/16 177 lb (80.3 kg)  07/09/16 175 lb (79.4 kg)  07/04/15 178 lb 3.2 oz (80.8 kg)    ------------------------------------------------------------------------  GERD- Pt was having reflux symptoms when she was here last and started on Omeprazole. She reports that it is better.   Rash- pt reports that about 2 weeks ago she developed a rash under her left breast. She reports that it is itchy and she has tried Cortisone cream on it with mild relief. Rash is not raised up and red.   She has also been having migraines more often, she believes it could be because she is trying to fight a cold, but her head hurts in the frontal area.   Prior to Admission medications   Medication Sig Start Date End Date Taking? Authorizing Provider  Cholecalciferol (VITAMIN D) 2000 UNITS CAPS Take 2,000 Units by mouth daily.    Historical Provider, MD  ferrous fumarate (HEMOCYTE - 106 MG FE) 325 (106 FE) MG TABS tablet Take 1 tablet by mouth daily.    Historical Provider, MD  losartan (COZAAR) 25 MG tablet TAKE ONE TABLET BY MOUTH ONCE DAILY 04/30/16   Maple Hudson., MD  metFORMIN  (GLUCOPHAGE) 1000 MG tablet TAKE ONE TABLET BY MOUTH TWICE DAILY 04/30/16   Maple Hudson., MD  omeprazole (PRILOSEC) 20 MG capsule Take 1 capsule (20 mg total) by mouth daily. 07/09/16   Dalyn Becker Hulen Shouts., MD  Psyllium (METAMUCIL) 48.57 % POWD Take by mouth.    Historical Provider, MD    Patient Active Problem List   Diagnosis Date Noted  . Vitamin D deficiency 06/19/2015  . Obesity 06/19/2015  . Hyperlipidemia 06/19/2015  . GERD (gastroesophageal reflux disease) 06/19/2015  . Diabetes mellitus, type 2 (HCC) 03/25/2015    Past Medical History:  Diagnosis Date  . Chronic kidney disease   . Diabetes mellitus without complication (HCC)   . Heart murmur   . Migraine     Social History   Social History  . Marital status: Married    Spouse name: Reuel Boom  . Number of children: 1  . Years of education: masters   Occupational History  .  [employer]   Social History Main Topics  . Smoking status: Never Smoker  . Smokeless tobacco: Never Used  . Alcohol use No  . Drug use: No  . Sexual activity: Not on file   Other Topics Concern  . Not on file   Social History Narrative  . No narrative on file  Allergies  Allergen Reactions  . Penicillins Hives and Swelling  . Sulfur Hives    Review of Systems  Constitutional: Negative.   HENT: Negative.   Eyes: Negative.   Respiratory: Negative.   Cardiovascular: Negative.   Gastrointestinal: Negative.   Genitourinary: Negative.   Musculoskeletal: Negative.   Skin: Positive for itching and rash.  Neurological: Positive for headaches.  Endo/Heme/Allergies: Negative.   Psychiatric/Behavioral: Negative.     Immunization History  Administered Date(s) Administered  . Hepatitis B 05/25/2013, 06/30/2013  . Td 07/27/1997, 10/31/2012    Objective:  BP 114/68 (BP Location: Left Arm, Patient Position: Sitting, Cuff Size: Normal)   Pulse 78   Temp 97.8 F (36.6 C) (Oral)   Resp 14   Wt 177 lb (80.3 kg)   BMI  28.57 kg/m   Physical Exam  Constitutional: She is oriented to person, place, and time and well-developed, well-nourished, and in no distress.  HENT:  Head: Normocephalic and atraumatic.  Right Ear: External ear normal.  Left Ear: External ear normal.  Nose: Nose normal.  Mouth/Throat: Oropharynx is clear and moist.  Eyes: Conjunctivae and EOM are normal. Pupils are equal, round, and reactive to light.  Neck: Normal range of motion. Neck supple.  Cardiovascular: Normal rate, regular rhythm, normal heart sounds and intact distal pulses.   Pulmonary/Chest: Effort normal and breath sounds normal.  Abdominal: Soft.  Musculoskeletal: Normal range of motion.  Neurological: She is alert and oriented to person, place, and time. She has normal reflexes. Gait normal. GCS score is 15.  Skin: Skin is warm and dry.  Psychiatric: Mood, memory, affect and judgment normal.    Lab Results  Component Value Date   WBC 4.0 07/10/2016   HGB 14.9 03/19/2014   HCT 41.8 07/10/2016   PLT 233 07/10/2016   GLUCOSE 148 (H) 07/10/2016   CHOL 191 07/10/2016   TRIG 66 07/10/2016   HDL 62 07/10/2016   LDLCALC 116 (H) 07/10/2016   TSH 1.590 07/10/2016   HGBA1C 8.0 (H) 07/10/2016    CMP     Component Value Date/Time   NA 143 07/10/2016 0908   NA 143 10/31/2012 1255   K 4.5 07/10/2016 0908   K 3.8 10/31/2012 1255   CL 103 07/10/2016 0908   CL 110 (H) 10/31/2012 1255   CO2 23 07/10/2016 0908   CO2 27 10/31/2012 1255   GLUCOSE 148 (H) 07/10/2016 0908   GLUCOSE 122 (H) 10/31/2012 1255   BUN 13 07/10/2016 0908   BUN 12 10/31/2012 1255   CREATININE 0.79 07/10/2016 0908   CREATININE 0.80 10/31/2012 1255   CALCIUM 9.1 07/10/2016 0908   CALCIUM 8.7 10/31/2012 1255   PROT 6.7 07/10/2016 0908   PROT 7.8 10/31/2012 1255   ALBUMIN 4.2 07/10/2016 0908   ALBUMIN 4.3 10/31/2012 1255   AST 13 07/10/2016 0908   AST 14 (L) 10/31/2012 1255   ALT 10 07/10/2016 0908   ALT 17 10/31/2012 1255   ALKPHOS 70  07/10/2016 0908   ALKPHOS 84 10/31/2012 1255   BILITOT 0.4 07/10/2016 0908   BILITOT 0.3 10/31/2012 1255   GFRNONAA 88 07/10/2016 0908   GFRNONAA >60 10/31/2012 1255   GFRAA 101 07/10/2016 0908   GFRAA >60 10/31/2012 1255    Assessment and Plan :  1. Type 2 diabetes mellitus without complication, without long-term current use of insulin (HCC) Discussed reasdons for good DM control with patient. - POCT HgB A1C- 7.7 today. Better but would like it lower. Start EldoraFarxiga and  follow up in 3 months. If Marcelline DeistFarxiga is not covered or she can not use the [employer] pay card, she will call back to have Actos 15 mg called in.  - dapagliflozin propanediol (FARXIGA) 5 MG TABS tablet; Take 5 mg by mouth daily.  Dispense: 30 tablet; Refill: 11  2. Allergic rhinitis, unspecified chronicity, unspecified seasonality, unspecified trigger Early URI. Treat with Flonase.  - fluticasone (FLONASE) 50 MCG/ACT nasal spray; Place 2 sprays into both nostrils daily.  Dispense: 16 g; Refill: 11  If not covered will send in Actos 15 mg daily  3.HLD  HPI, Exam, and A&P Transcribed under the direction and in the presence of Jerold Yoss L. Wendelyn BreslowGilbert Jr, MD  Electronically Signed: Dimas ChyleBrittany Byrd, CMA I have done the exam and reviewed the above chart and it is accurate to the best of my knowledge. DentistDragon  technology has been used in this note in any air is in the dictation or transcription are unintentional.  Julieanne Mansonichard Lorretta Kerce MD [facility] 10/08/2016 11:43 AM

## 2017-02-11 ENCOUNTER — Ambulatory Visit (INDEPENDENT_AMBULATORY_CARE_PROVIDER_SITE_OTHER): Payer: Managed Care, Other (non HMO) | Admitting: Family Medicine

## 2017-02-11 ENCOUNTER — Encounter: Payer: Self-pay | Admitting: Family Medicine

## 2017-02-11 VITALS — BP 132/80 | HR 72 | Temp 98.0°F | Resp 16 | Wt 176.0 lb

## 2017-02-11 DIAGNOSIS — M545 Low back pain: Secondary | ICD-10-CM | POA: Diagnosis not present

## 2017-02-11 DIAGNOSIS — K5900 Constipation, unspecified: Secondary | ICD-10-CM | POA: Diagnosis not present

## 2017-02-11 DIAGNOSIS — E119 Type 2 diabetes mellitus without complications: Secondary | ICD-10-CM | POA: Diagnosis not present

## 2017-02-11 LAB — POCT UA - MICROALBUMIN: Microalbumin Ur, POC: 20 mg/L

## 2017-02-11 LAB — POCT URINALYSIS DIPSTICK
Bilirubin, UA: NEGATIVE
Blood, UA: NEGATIVE
Glucose, UA: 1000
KETONES UA: NEGATIVE
Leukocytes, UA: NEGATIVE
Nitrite, UA: NEGATIVE
PH UA: 6 (ref 5.0–8.0)
PROTEIN UA: NEGATIVE
SPEC GRAV UA: 1.02 (ref 1.010–1.025)
Urobilinogen, UA: 0.2 E.U./dL

## 2017-02-11 LAB — POCT GLYCOSYLATED HEMOGLOBIN (HGB A1C): HEMOGLOBIN A1C: 7.5

## 2017-02-11 MED ORDER — CHOLESTYRAMINE 4 GM/DOSE PO POWD: 4.0000 g | Freq: Three times a day (TID) | ORAL | 12 refills | Status: DC

## 2017-02-11 NOTE — Progress Notes (Signed)
Subjective:  HPI   Diabetes Mellitus Type II, Follow-up:   Lab Results  Component Value Date   HGBA1C 7.5 02/11/2017   HGBA1C 7.7 10/08/2016   HGBA1C 8.0 (H) 07/10/2016    Last seen for diabetes 4 months ago.  Management since then includes started [place]. She reports good compliance with treatment. She is not having side effects.  Home blood sugar records: she is not checking her sugars as her meter is broken.   Episodes of hypoglycemia? no   Current Insulin Regimen: n/a Current exercise: walking 3 days a week.  Pertinent Labs:    Component Value Date/Time   CHOL 191 07/10/2016 0908   CHOL 158 10/31/2012 1255   TRIG 66 07/10/2016 0908   TRIG 48 10/31/2012 1255   HDL 62 07/10/2016 0908   HDL 63 (H) 10/31/2012 1255   LDLCALC 116 (H) 07/10/2016 0908   LDLCALC 85 10/31/2012 1255   CREATININE 0.79 07/10/2016 0908   CREATININE 0.80 10/31/2012 1255    Wt Readings from Last 3 Encounters:  02/11/17 176 lb (79.8 kg)  10/08/16 177 lb (80.3 kg)  07/09/16 175 lb (79.4 kg)    ------------------------------------------------------------------------  Pt reports that she has been having back pain for the last month. She says it does not hurt every day but when he does hurt she can tell it. She says it is worse when she is laying down. Occasionally it will hurt when she is sitting up. She reports that yesterday she started feeling like she had to urinate but when she went, nothing came out. She has also had urgency. Denies any dysuria.    Prior to Admission medications   Medication Sig Start Date End Date Taking? Authorizing Provider  Cholecalciferol (VITAMIN D) 2000 UNITS CAPS Take 2,000 Units by mouth daily.    Historical Provider, MD  dapagliflozin propanediol (FARXIGA) 5 MG TABS tablet Take 5 mg by mouth daily. 10/08/16   Quinteria Chisum Hulen Shouts., MD  ferrous fumarate (HEMOCYTE - 106 MG FE) 325 (106 FE) MG TABS tablet Take 1 tablet by mouth daily.    Historical Provider,  MD  fluticasone (FLONASE) 50 MCG/ACT nasal spray Place 2 sprays into both nostrils daily. 10/08/16   Ryley Bachtel Hulen Shouts., MD  losartan (COZAAR) 25 MG tablet TAKE ONE TABLET BY MOUTH ONCE DAILY 04/30/16   Maple Hudson., MD  metFORMIN (GLUCOPHAGE) 1000 MG tablet TAKE ONE TABLET BY MOUTH TWICE DAILY 04/30/16   Maple Hudson., MD  omeprazole (PRILOSEC) 20 MG capsule Take 1 capsule (20 mg total) by mouth daily. 07/09/16   Leander Tout Hulen Shouts., MD  Psyllium (METAMUCIL) 48.57 % POWD Take by mouth.    Historical Provider, MD    Patient Active Problem List   Diagnosis Date Noted  . Vitamin D deficiency 06/19/2015  . Obesity 06/19/2015  . Hyperlipidemia 06/19/2015  . GERD (gastroesophageal reflux disease) 06/19/2015  . Diabetes mellitus, type 2 (HCC) 03/25/2015    Past Medical History:  Diagnosis Date  . Chronic kidney disease   . Diabetes mellitus without complication (HCC)   . Heart murmur   . Migraine     Social History   Social History  . Marital status: Married    Spouse name: Reuel Boom  . Number of children: 1  . Years of education: masters   Occupational History  .  [employer]   Social History Main Topics  . Smoking status: Never Smoker  . Smokeless tobacco: Never Used  .  Alcohol use No  . Drug use: No  . Sexual activity: Not on file   Other Topics Concern  . Not on file   Social History Narrative  . No narrative on file    Allergies  Allergen Reactions  . Penicillins Hives and Swelling  . Sulfur Hives    Review of Systems  Constitutional: Negative.   HENT: Negative.   Eyes: Negative.   Respiratory: Negative.   Cardiovascular: Negative.   Gastrointestinal: Negative.   Genitourinary: Positive for urgency.  Musculoskeletal: Positive for back pain.  Skin: Negative.   Neurological: Negative.   Endo/Heme/Allergies: Negative.   Psychiatric/Behavioral: Negative.     Immunization History  Administered Date(s) Administered  . Hepatitis B  05/25/2013, 06/30/2013  . Td 07/27/1997, 10/31/2012    Objective:  BP 132/80 (BP Location: Left Arm, Patient Position: Sitting, Cuff Size: Normal)   Pulse 72   Temp 98 F (36.7 C) (Oral)   Resp 16   Wt 176 lb (79.8 kg)   BMI 28.41 kg/m   Physical Exam  Constitutional: She is oriented to person, place, and time and well-developed, well-nourished, and in no distress.  HENT:  Head: Normocephalic and atraumatic.  Eyes: Conjunctivae and EOM are normal. Pupils are equal, round, and reactive to light.  Neck: Normal range of motion. Neck supple.  Cardiovascular: Normal rate, regular rhythm, normal heart sounds and intact distal pulses.   Pulmonary/Chest: Effort normal and breath sounds normal.  Musculoskeletal: Normal range of motion. She exhibits tenderness (mild tenderness of SI joints).  Neurological: She is alert and oriented to person, place, and time. She has normal reflexes. Gait normal. GCS score is 15.  Skin: Skin is warm and dry.  Psychiatric: Mood, memory, affect and judgment normal.    Lab Results  Component Value Date   WBC 4.0 07/10/2016   HGB 14.9 03/19/2014   HCT 41.8 07/10/2016   PLT 233 07/10/2016   GLUCOSE 148 (H) 07/10/2016   CHOL 191 07/10/2016   TRIG 66 07/10/2016   HDL 62 07/10/2016   LDLCALC 116 (H) 07/10/2016   TSH 1.590 07/10/2016   HGBA1C 7.5 02/11/2017   MICROALBUR 20 02/11/2017    CMP     Component Value Date/Time   NA 143 07/10/2016 0908   NA 143 10/31/2012 1255   K 4.5 07/10/2016 0908   K 3.8 10/31/2012 1255   CL 103 07/10/2016 0908   CL 110 (H) 10/31/2012 1255   CO2 23 07/10/2016 0908   CO2 27 10/31/2012 1255   GLUCOSE 148 (H) 07/10/2016 0908   GLUCOSE 122 (H) 10/31/2012 1255   BUN 13 07/10/2016 0908   BUN 12 10/31/2012 1255   CREATININE 0.79 07/10/2016 0908   CREATININE 0.80 10/31/2012 1255   CALCIUM 9.1 07/10/2016 0908   CALCIUM 8.7 10/31/2012 1255   PROT 6.7 07/10/2016 0908   PROT 7.8 10/31/2012 1255   ALBUMIN 4.2 07/10/2016  0908   ALBUMIN 4.3 10/31/2012 1255   AST 13 07/10/2016 0908   AST 14 (L) 10/31/2012 1255   ALT 10 07/10/2016 0908   ALT 17 10/31/2012 1255   ALKPHOS 70 07/10/2016 0908   ALKPHOS 84 10/31/2012 1255   BILITOT 0.4 07/10/2016 0908   BILITOT 0.3 10/31/2012 1255   GFRNONAA 88 07/10/2016 0908   GFRNONAA >60 10/31/2012 1255   GFRAA 101 07/10/2016 0908   GFRAA >60 10/31/2012 1255    Assessment and Plan :  1. Type 2 diabetes mellitus without complication, without long-term current use of insulin (  HCC)  - POCT HgB A1C 7.5 today. Pt will work o diet and exercise to get a1c below 7.  - POCT UA - Microalbumin  2. Low back pain, unspecified back pain laterality, unspecified chronicity, with sciatica presence unspecified  - POCT urinalysis dipstick  3. Constipation, unspecified constipation type  - cholestyramine (QUESTRAN) 4 GM/DOSE powder; Take 1 packet (4 g total) by mouth 3 (three) times daily with meals.  Dispense: 378 g; Refill: 12   HPI, Exam, and A&P Transcribed under the direction and in the presence of Toi Stelly L. Wendelyn Breslow, MD  Electronically Signed: Silvio Pate, CMA I have done the exam and reviewed the above chart and it is accurate to the best of my knowledge. Dentist has been used in this note in any air is in the dictation or transcription are unintentional.  Julieanne Manson MD [facility] 02/11/2017 2:54 PM

## 2017-02-11 NOTE — Patient Instructions (Signed)

## 2017-03-26 ENCOUNTER — Other Ambulatory Visit: Payer: Self-pay | Admitting: Family Medicine

## 2017-03-26 ENCOUNTER — Other Ambulatory Visit: Payer: Self-pay | Admitting: Unknown Physician Specialty

## 2017-03-26 DIAGNOSIS — Z1231 Encounter for screening mammogram for malignant neoplasm of breast: Secondary | ICD-10-CM

## 2017-04-09 ENCOUNTER — Ambulatory Visit
Admission: RE | Admit: 2017-04-09 | Discharge: 2017-04-09 | Disposition: A | Discharge status: Home / Self Care | Payer: PRIVATE HEALTH INSURANCE | Source: Ambulatory Visit | Attending: Family Medicine | Admitting: Family Medicine

## 2017-04-09 DIAGNOSIS — Z1231 Encounter for screening mammogram for malignant neoplasm of breast: Secondary | ICD-10-CM | POA: Diagnosis present

## 2017-04-13 ENCOUNTER — Inpatient Hospital Stay
Admission: RE | Admit: 2017-04-13 | Discharge: 2017-04-13 | Disposition: A | Discharge status: Home / Self Care | Payer: Self-pay | Source: Ambulatory Visit | Attending: *Deleted | Admitting: *Deleted

## 2017-04-13 ENCOUNTER — Other Ambulatory Visit: Payer: Self-pay | Admitting: *Deleted

## 2017-04-13 DIAGNOSIS — Z9289 Personal history of other medical treatment: Secondary | ICD-10-CM

## 2017-05-05 ENCOUNTER — Encounter: Payer: Self-pay | Admitting: Certified Nurse Midwife

## 2017-05-05 ENCOUNTER — Ambulatory Visit (INDEPENDENT_AMBULATORY_CARE_PROVIDER_SITE_OTHER): Payer: PRIVATE HEALTH INSURANCE | Admitting: Certified Nurse Midwife

## 2017-05-05 VITALS — BP 112/72 | HR 70 | Ht 66.0 in | Wt 167.0 lb

## 2017-05-05 DIAGNOSIS — Z01419 Encounter for gynecological examination (general) (routine) without abnormal findings: Secondary | ICD-10-CM | POA: Diagnosis not present

## 2017-05-05 DIAGNOSIS — R1013 Epigastric pain: Secondary | ICD-10-CM | POA: Diagnosis not present

## 2017-05-05 DIAGNOSIS — Z124 Encounter for screening for malignant neoplasm of cervix: Secondary | ICD-10-CM | POA: Diagnosis not present

## 2017-05-05 NOTE — Progress Notes (Signed)
Gynecology Annual Exam  PCP: Maple Hudson., MD  Chief Complaint:  Chief Complaint  Patient presents with  . Gynecologic Exam    History of Present Illness: Jacqueline Romero is a 51 y.o. G1P1001 who presents for her annual exam. The patient complains of upper abdominal pain and low back pain.  Her upper abdominal pain has been going on for years but has now become daily and lasts all day. It worsens after eating and she reports feeling full fast. Sips of ginger ale, Mylanta, or taking Prilosec provides only partial short term relief.  She has had a laparscopic cholecystectomy more than 10 years ago. She had a EGD done in 2016 which showed duodenitis. Colonoscopy in 2016 showed diverticulosis.  Has not had a follow up appointment with GI since then. She also complains of chronic lower back pain over the last year that has worsened in the last  4-5 months. The pain is worse with movement (getting up, sitting down). Has not taken any analgesics for the pain. Her husband's massage makes it worse, heating pad makes it better. Denies any precipitating injury or fall. Dr Sullivan Lone checked her urine at an April visit and that was normal. Denies dysuria, fever Her menses are absent, since her [facility] in 2009 for AUB and fibroids. Last pap smear: 03/06/2016, results were NIL The patient is sexually active. She does not have dyspareunia.   Her past medical history is remarkable for diabetes II, hypertension, anemia, seasonal allergies, GERD and chronic constipation.   The patient does perform self breast exams. Her last mammogram was 04/09/2017, results were negative.   Her sister was diagnosed with early breast cancer this year at the age of 43 (had lumpectomy and radiation). Genetic testing has not been done.  There is no family history of ovarian cancer. Mother died from pancreatic cancer  The patient denies smoking.  She denies drinking alcohol   She denies illegal drug use.  The  patient exercises by walking.  The patient denies current symptoms of depression.    Last cholesterol screening was in 07/10/2016 and was normal. Her hemoglobin A1C was 7.5 in April 2018  Review of Systems: Review of Systems  Constitutional: Positive for weight loss (10# weight loss). Negative for chills and fever.  HENT: Positive for sore throat (and hoarseness-was seeing ENT). Negative for congestion and sinus pain.   Eyes: Negative for blurred vision and pain.  Respiratory: Negative for hemoptysis, shortness of breath and wheezing.   Cardiovascular: Negative for chest pain, palpitations and leg swelling.  Gastrointestinal: Positive for abdominal pain (upper abdominal), constipation and heartburn. Negative for blood in stool, diarrhea, nausea and vomiting.       Positive for feeling full fast, worse upper abdominal pain after eating  Genitourinary: Negative for dysuria, frequency, hematuria and urgency.  Musculoskeletal: Positive for back pain. Negative for joint pain and myalgias.  Skin: Negative for itching and rash.  Neurological: Negative for dizziness, tingling and headaches.  Endo/Heme/Allergies: Positive for environmental allergies. Negative for polydipsia. Does not bruise/bleed easily.       Negative for hirsutism   Psychiatric/Behavioral: Negative for depression. The patient is not nervous/anxious and does not have insomnia.     Past Medical History:  Past Medical History:  Diagnosis Date  . Chronic kidney disease   . Diabetes mellitus without complication (HCC)   . Diverticulosis   . Duodenitis   . Heart murmur   . Migraine     Past  Surgical History:  Past Surgical History:  Procedure Laterality Date  . CHOLECYSTECTOMY     laparscopic-Dr Byrnett  . COLONOSCOPY WITH PROPOFOL N/A 04/15/2015   Procedure: COLONOSCOPY WITH PROPOFOL;  Surgeon: Christena DeemMartin U Skulskie, MD;  [facility];  Service: Endoscopy;  Laterality: N/A;  . ESOPHAGOGASTRODUODENOSCOPY N/A  04/15/2015   Procedure: ESOPHAGOGASTRODUODENOSCOPY (EGD);  Surgeon: Christena DeemMartin U Skulskie, MD;  [facility];  Service: Endoscopy;  Laterality: N/A;  . KNEE ARTHROSCOPY W/ SYNOVECTOMY Left   . LAPAROSCOPIC SUPRACERVICAL HYSTERECTOMY  2009   fibroids/AUB  . TUBAL LIGATION Bilateral     Family History:  Family History  Problem Relation Age of Onset  . Pancreatic cancer Mother [id number]  . Throat cancer Father   . Hypertension Father   . Hypertension Sister   . Diabetes Sister   . Breast cancer Sister [id number]  . Hypertension Brother   . Diabetes Maternal Aunt   . Hypertension Maternal Aunt   . Hypertension Maternal Uncle   . Diabetes Maternal Uncle     Social History:  Social History   Social History  . Marital status: Married    Spouse name: Reuel BoomDaniel  . Number of children: 1  . Years of education: masters   Occupational History  . teacher/ coach [employer]   Social History Main Topics  . Smoking status: Never Smoker  . Smokeless tobacco: Never Used  . Alcohol use No  . Drug use: No  . Sexual activity: Yes    Partners: Male    Birth control/ protection: Surgical   Other Topics Concern  . Not on file   Social History Narrative   Has one granddaughter named " [name]" 61 years old (born 2016)    Allergies:  Allergies  Allergen Reactions  . Penicillins Hives and Swelling  . Sulfur Hives    Medications:  Current Outpatient Prescriptions:  .  Cholecalciferol (VITAMIN D) 2000 UNITS CAPS, Take 2,000 Units by mouth daily., Disp: , Rfl:  .  dapagliflozin propanediol (FARXIGA) 5 MG TABS tablet, Take 5 mg by mouth daily., Disp: 30 tablet, Rfl: 11 .  ferrous fumarate (HEMOCYTE - 106 MG FE) 325 (106 FE) MG TABS tablet, Take 1 tablet by mouth daily., Disp: , Rfl:  .  fluticasone (FLONASE) 50 MCG/ACT nasal spray, Place 2 sprays into both nostrils daily., Disp: 16 g, Rfl: 11 .  losartan (COZAAR) 25 MG tablet, TAKE ONE TABLET BY MOUTH ONCE DAILY, Disp: 30 tablet, Rfl: 12 .   metFORMIN (GLUCOPHAGE) 1000 MG tablet, TAKE ONE TABLET BY MOUTH TWICE DAILY, Disp: 60 tablet, Rfl: 12 .  omeprazole (PRILOSEC) 20 MG capsule, Take 1 capsule (20 mg total) by mouth daily., Disp: 30 capsule, Rfl: 12 .  Psyllium (METAMUCIL) 48.57 % POWD, Take by mouth., Disp: , Rfl:  .  cholestyramine (QUESTRAN) 4 GM/DOSE powder, Take 1 packet (4 g total) by mouth 3 (three) times daily with meals. (Patient not taking: Reported on 05/05/2017), Disp: 378 g, Rfl: 12 .  pravastatin (PRAVACHOL) 10 MG tablet, pravastatin 10 mg tablet, Disp: , Rfl:   Physical Exam Vitals:BP 112/72   Pulse 70   Ht 5\' 6"  (1.676 m)   Wt 75.8 kg (167 lb)   BMI 26.95 kg/m  General: pleasant BF in  NAD HEENT: normocephalic, anicteric Neck: no thyroid enlargement, no palpable nodules, no cervical lymphadenopathy  Pulmonary: No increased work of breathing, CTAB Cardiovascular: RRR, without murmur  Breast: Breast symmetrical, no tenderness, no palpable nodules or masses, no skin or  nipple retraction present, no nipple discharge.  No axillary, infraclavicular or supraclavicular lymphadenopathy. Abdomen: Soft, non-distended, epigastric tenderness present, +/- Murphy's sign.  Umbilicus without lesions.  No hepatomegaly or masses palpable. No evidence of hernia. Genitourinary:  External: Normal external female genitalia.  Normal urethral meatus, normal Bartholin's and Skene's glands.    Vagina: Normal vaginal mucosa, no evidence of prolapse.    Cervix: Grossly normal in appearance, no bleeding, non-tender  Uterus: surgically absent  Adnexa: No adnexal masses, non-tender  Rectal: deferred  Lymphatic: no evidence of inguinal lymphadenopathy Extremities: no edema, erythema, or tenderness Neurologic: Grossly intact Psychiatric: mood appropriate, affect full Back/ Musculoskeletal: tenderness of paraspinal muscles on left with palpation in lumbar area. No tenderness on right, no point tenderness over vertebrae, no tenderness over  SI joint area.     Assessment: 51 y.o. G1P1001 annual gyn exam Epigastric pain Lower back pain  Plan:   1) Breast cancer screening - recommend monthly self breast exam and annual mammograms Mammogram is up to date.  2) Cervical cancer screening - Pap was done. ASCCP guidelines and rational discussed.  Patient opts for yearly screening interval  3) Colon cancer screening-Colonoscopy done 2016  4) Routine healthcare maintenance including cholesterol and diabetes screening managed by PCP  5) Will order abdominal ultrasound and refer patient to GI for further evaluation of epigastric pain. Encouraged to return to PCP for further evaluation of lower back pain. Can try a topical analgesic for pain.  6) RTO for annual in 1 year.  Farrel Conners, CNM

## 2017-05-07 LAB — IGP,RFX APTIMA HPV ALL PTH: PAP Smear Comment: 0

## 2017-05-10 DIAGNOSIS — R1013 Epigastric pain: Secondary | ICD-10-CM | POA: Insufficient documentation

## 2017-05-13 ENCOUNTER — Ambulatory Visit
Admission: RE | Admit: 2017-05-13 | Discharge: 2017-05-13 | Disposition: A | Discharge status: Home / Self Care | Payer: PRIVATE HEALTH INSURANCE | Source: Ambulatory Visit | Attending: Certified Nurse Midwife | Admitting: Certified Nurse Midwife

## 2017-05-13 DIAGNOSIS — R932 Abnormal findings on diagnostic imaging of liver and biliary tract: Secondary | ICD-10-CM | POA: Insufficient documentation

## 2017-05-13 DIAGNOSIS — Z9049 Acquired absence of other specified parts of digestive tract: Secondary | ICD-10-CM | POA: Diagnosis not present

## 2017-05-13 DIAGNOSIS — R1013 Epigastric pain: Secondary | ICD-10-CM | POA: Diagnosis present

## 2017-05-14 ENCOUNTER — Other Ambulatory Visit: Payer: Self-pay | Admitting: Family Medicine

## 2017-05-14 NOTE — Telephone Encounter (Signed)
Dr. Sullivan LoneGilbert patient. Last OV 4/26

## 2017-06-14 ENCOUNTER — Ambulatory Visit: Payer: Managed Care, Other (non HMO) | Admitting: Family Medicine

## 2017-06-15 ENCOUNTER — Other Ambulatory Visit: Payer: Self-pay | Admitting: Gastroenterology

## 2017-06-15 DIAGNOSIS — R1084 Generalized abdominal pain: Secondary | ICD-10-CM

## 2017-06-16 ENCOUNTER — Ambulatory Visit (INDEPENDENT_AMBULATORY_CARE_PROVIDER_SITE_OTHER): Payer: PRIVATE HEALTH INSURANCE | Admitting: Family Medicine

## 2017-06-16 ENCOUNTER — Other Ambulatory Visit: Payer: Self-pay | Admitting: Family Medicine

## 2017-06-16 VITALS — BP 110/72 | HR 72 | Temp 97.9°F | Resp 10 | Ht 66.0 in | Wt 172.0 lb

## 2017-06-16 DIAGNOSIS — Z0000 Encounter for general adult medical examination without abnormal findings: Secondary | ICD-10-CM | POA: Diagnosis not present

## 2017-06-16 DIAGNOSIS — E119 Type 2 diabetes mellitus without complications: Secondary | ICD-10-CM

## 2017-06-16 DIAGNOSIS — Z111 Encounter for screening for respiratory tuberculosis: Secondary | ICD-10-CM | POA: Diagnosis not present

## 2017-06-16 DIAGNOSIS — E7800 Pure hypercholesterolemia, unspecified: Secondary | ICD-10-CM | POA: Diagnosis not present

## 2017-06-16 DIAGNOSIS — Z23 Encounter for immunization: Secondary | ICD-10-CM | POA: Diagnosis not present

## 2017-06-16 NOTE — Progress Notes (Signed)
Patient: Jacqueline Romero, Female    DOB: Sep 25, 1966, 51 y.o.   MRN: [id number] Visit Date: 06/16/2017  Today's Provider: Megan Mansichard Kenley Troop Jr, MD   Chief Complaint  Patient presents with  . Annual Exam   Subjective:  Jacqueline Romero is a 51 y.o. female who presents today for health maintenance and complete physical. She feels well. She reports exercising walking 2 days a week for about 20 to 30 minutes. She reports she is sleeping well. Immunization History  Administered Date(s) Administered  . Hepatitis B 05/25/2013, 06/30/2013  . Td 07/27/1997, 10/31/2012  Mammogram 04/14/17 normal Pap smear 05/05/17 normal, through GYN Colonoscopy 04/15/15 Dr Marva PandaSkulskie, diverticulosis, mild gastritis, no polyps.  Review of Systems  Constitutional: Negative.   HENT: Positive for sore throat.   Eyes: Negative.   Respiratory: Negative.   Cardiovascular: Negative.   Gastrointestinal: Positive for abdominal pain.  Endocrine: Negative.   Genitourinary: Negative.   Musculoskeletal: Negative.   Skin: Negative.   Allergic/Immunologic: Negative.   Neurological: Negative.   Hematological: Negative.   Psychiatric/Behavioral: Negative.     Social History   Social History  . Marital status: Married    Spouse name: Reuel BoomDaniel  . Number of children: 1  . Years of education: masters   Occupational History  . teacher/ coach [employer]   Social History Main Topics  . Smoking status: Never Smoker  . Smokeless tobacco: Never Used  . Alcohol use No  . Drug use: No  . Sexual activity: Yes    Partners: Male    Birth control/ protection: Surgical   Other Topics Concern  . Not on file   Social History Narrative   Has one granddaughter named " [name]" 52 years old (born 2016)    Patient Active Problem List   Diagnosis Date Noted  . Epigastric abdominal pain 05/10/2017  . Vitamin D deficiency 06/19/2015  . Obesity 06/19/2015  . Hyperlipidemia 06/19/2015  . GERD (gastroesophageal reflux  disease) 06/19/2015  . Diabetes mellitus, type 2 (HCC) 03/25/2015    Past Surgical History:  Procedure Laterality Date  . CHOLECYSTECTOMY     laparscopic-Dr Byrnett  . COLONOSCOPY WITH PROPOFOL N/A 04/15/2015   Procedure: COLONOSCOPY WITH PROPOFOL;  Surgeon: Christena DeemMartin U Skulskie, MD;  [facility];  Service: Endoscopy;  Laterality: N/A;  . ESOPHAGOGASTRODUODENOSCOPY N/A 04/15/2015   Procedure: ESOPHAGOGASTRODUODENOSCOPY (EGD);  Surgeon: Christena DeemMartin U Skulskie, MD;  [facility];  Service: Endoscopy;  Laterality: N/A;  . KNEE ARTHROSCOPY W/ SYNOVECTOMY Left   . LAPAROSCOPIC SUPRACERVICAL HYSTERECTOMY  2009   fibroids/AUB  . TUBAL LIGATION Bilateral     Her family history includes Breast cancer (age of onset: 5663) in her sister; Diabetes in her maternal aunt, maternal uncle, and sister; Hypertension in her brother, father, maternal aunt, maternal uncle, and sister; Pancreatic cancer (age of onset: 4685) in her mother; Throat cancer in her father.     Outpatient Encounter Prescriptions as of 06/16/2017  Medication Sig  . Cholecalciferol (VITAMIN D) 2000 UNITS CAPS Take 2,000 Units by mouth daily.  . cholestyramine (QUESTRAN) 4 GM/DOSE powder Take 1 packet (4 g total) by mouth 3 (three) times daily with meals.  . dapagliflozin propanediol (FARXIGA) 5 MG TABS tablet Take 5 mg by mouth daily.  . ferrous fumarate (HEMOCYTE - 106 MG FE) 325 (106 FE) MG TABS tablet Take 1 tablet by mouth daily.  Marland Kitchen. losartan (COZAAR) 25 MG tablet TAKE ONE TABLET BY MOUTH ONCE DAILY  . metFORMIN (GLUCOPHAGE) 1000 MG tablet  TAKE ONE TABLET BY MOUTH TWICE DAILY  . pantoprazole (PROTONIX) 40 MG tablet Take by mouth.  . Psyllium (METAMUCIL) 48.57 % POWD Take by mouth.  . RABEprazole (ACIPHEX) 20 MG tablet Take by mouth.  . [DISCONTINUED] fluticasone (FLONASE) 50 MCG/ACT nasal spray Place 2 sprays into both nostrils daily.  . [DISCONTINUED] omeprazole (PRILOSEC) 20 MG capsule Take 1 capsule (20 mg total) by  mouth daily.  . [DISCONTINUED] pravastatin (PRAVACHOL) 10 MG tablet pravastatin 10 mg tablet   No facility-administered encounter medications on file as of 06/16/2017.     Patient Care Team: Maple Hudson., MD as PCP - General (Family Medicine)      Objective:   Vitals:  Vitals:   06/16/17 1002  BP: 110/72  Pulse: 72  Resp: 10  Temp: 97.9 F (36.6 C)  Weight: 172 lb (78 kg)  Height: 5\' 6"  (1.676 m)    Physical Exam  Constitutional: She is oriented to person, place, and time. She appears well-developed and well-nourished.  HENT:  Head: Normocephalic and atraumatic.  Right Ear: External ear normal.  Left Ear: External ear normal.  Nose: Nose normal.  Mouth/Throat: Oropharynx is clear and moist.  Eyes: Conjunctivae are normal. No scleral icterus.  Neck: Neck supple. No thyromegaly present.  Cardiovascular: Normal rate, regular rhythm, normal heart sounds and intact distal pulses.   Pulmonary/Chest: Effort normal and breath sounds normal.  Abdominal: Soft.  Lymphadenopathy:    She has no cervical adenopathy.  Neurological: She is alert and oriented to person, place, and time.  Skin: Skin is warm and dry.  Psychiatric: She has a normal mood and affect. Her behavior is normal. Judgment and thought content normal.    Diabetic Foot Exam - Simple   Simple Foot Form Diabetic Foot exam was performed with the following findings:  Yes 06/16/2017 10:33 AM  Visual Inspection No deformities, no ulcerations, no other skin breakdown bilaterally:  Yes Sensation Testing Intact to touch and monofilament testing bilaterally:  Yes Pulse Check Posterior Tibialis and Dorsalis pulse intact bilaterally:  Yes Comments     Depression Screen PHQ 2/9 Scores 06/16/2017 07/09/2016  PHQ - 2 Score 0 0  PHQ- 9 Score 0 -      Assessment & Plan:   1. Aor breast/pelvic DRE.nnual physical exam Sees Gyn f - CBC with Differential/Platelet - TSH - Comprehensive metabolic panel  2.  Type 2 diabetes mellitus without complication, without long-term current use of insulin (HCC)  - Hemoglobin A1c  3. Pure hypercholesterolemia  - Lipid Panel With LDL/HDL Ratio  4. Screening for tuberculosis  - Quantiferon tb gold assay  5. Need for pneumococcal vaccination 6.Abdominal pain Pt seeing GI==has CT scheduled next week.  I have done the exam and reviewed the chart and it is accurate to the best of my knowledge. Dentist has been used and  any errors in dictation or transcription are unintentional. Julieanne Manson M.D. [facility]

## 2017-06-21 LAB — CBC WITH DIFFERENTIAL/PLATELET
BASOS ABS: 0 10*3/uL (ref 0.0–0.2)
Basos: 1 %
EOS (ABSOLUTE): 0.1 10*3/uL (ref 0.0–0.4)
Eos: 2 %
HEMOGLOBIN: 14.3 g/dL (ref 11.1–15.9)
Hematocrit: 41.3 % (ref 34.0–46.6)
Immature Grans (Abs): 0 10*3/uL (ref 0.0–0.1)
Immature Granulocytes: 0 %
LYMPHS ABS: 1.9 10*3/uL (ref 0.7–3.1)
Lymphs: 47 %
MCH: 29.5 pg (ref 26.6–33.0)
MCHC: 34.6 g/dL (ref 31.5–35.7)
MCV: 85 fL (ref 79–97)
MONOCYTES: 6 %
MONOS ABS: 0.2 10*3/uL (ref 0.1–0.9)
NEUTROS ABS: 1.8 10*3/uL (ref 1.4–7.0)
Neutrophils: 44 %
PLATELETS: 237 10*3/uL (ref 150–379)
RBC: 4.84 x10E6/uL (ref 3.77–5.28)
RDW: 13.6 % (ref 12.3–15.4)
WBC: 4.1 10*3/uL (ref 3.4–10.8)

## 2017-06-21 LAB — COMPREHENSIVE METABOLIC PANEL
ALBUMIN: 4.6 g/dL (ref 3.5–5.5)
ALK PHOS: 79 IU/L (ref 39–117)
ALT: 14 IU/L (ref 0–32)
AST: 16 IU/L (ref 0–40)
Albumin/Globulin Ratio: 1.8 (ref 1.2–2.2)
BILIRUBIN TOTAL: 0.3 mg/dL (ref 0.0–1.2)
BUN / CREAT RATIO: 16 (ref 9–23)
BUN: 13 mg/dL (ref 6–24)
CHLORIDE: 105 mmol/L (ref 96–106)
CO2: 25 mmol/L (ref 20–29)
CREATININE: 0.8 mg/dL (ref 0.57–1.00)
Calcium: 9.6 mg/dL (ref 8.7–10.2)
GFR calc Af Amer: 99 mL/min/{1.73_m2} (ref >59)
GFR calc non Af Amer: 86 mL/min/{1.73_m2} (ref >59)
GLUCOSE: 131 mg/dL — AB (ref 65–99)
Globulin, Total: 2.5 g/dL (ref 1.5–4.5)
Potassium: 4.5 mmol/L (ref 3.5–5.2)
Sodium: 145 mmol/L — ABNORMAL HIGH (ref 134–144)
Total Protein: 7.1 g/dL (ref 6.0–8.5)

## 2017-06-21 LAB — LIPID PANEL WITH LDL/HDL RATIO
CHOLESTEROL TOTAL: 195 mg/dL (ref 100–199)
HDL: 64 mg/dL (ref >39)
LDL Calculated: 120 mg/dL — ABNORMAL HIGH (ref 0–99)
LDl/HDL Ratio: 1.9 ratio (ref 0.0–3.2)
TRIGLYCERIDES: 53 mg/dL (ref 0–149)
VLDL CHOLESTEROL CAL: 11 mg/dL (ref 5–40)

## 2017-06-21 LAB — QUANTIFERON TB GOLD ASSAY (BLOOD)

## 2017-06-21 LAB — HEMOGLOBIN A1C
ESTIMATED AVERAGE GLUCOSE: 174 mg/dL
Hgb A1c MFr Bld: 7.7 % — ABNORMAL HIGH (ref 4.8–5.6)

## 2017-06-21 LAB — QUANTIFERON IN TUBE
QFT TB AG MINUS NIL VALUE: 0 IU/mL
QUANTIFERON MITOGEN VALUE: 6.46 IU/mL
QUANTIFERON NIL VALUE: 0.04 [IU]/mL
QUANTIFERON TB AG VALUE: 0.04 IU/mL
QUANTIFERON TB GOLD: NEGATIVE

## 2017-06-21 LAB — TSH: TSH: 1.12 u[IU]/mL (ref 0.450–4.500)

## 2017-06-23 ENCOUNTER — Ambulatory Visit
Admission: RE | Admit: 2017-06-23 | Discharge: 2017-06-23 | Disposition: A | Discharge status: Home / Self Care | Payer: PRIVATE HEALTH INSURANCE | Source: Ambulatory Visit | Attending: Gastroenterology | Admitting: Gastroenterology

## 2017-06-23 DIAGNOSIS — R1084 Generalized abdominal pain: Secondary | ICD-10-CM | POA: Diagnosis present

## 2017-06-23 MED ORDER — IOPAMIDOL (ISOVUE-300) INJECTION 61%
100.0000 mL | Freq: Once | INTRAVENOUS | Status: AC | PRN
  Administered 2017-06-23: 100 mL via INTRAVENOUS

## 2017-06-29 ENCOUNTER — Telehealth: Payer: Self-pay

## 2017-06-29 MED ORDER — ROSUVASTATIN CALCIUM 5 MG PO TABS: 5.0000 mg | ORAL_TABLET | Freq: Every day | ORAL | 3 refills | Status: DC

## 2017-06-29 NOTE — Telephone Encounter (Signed)
Pt advised, RX sent in and appointment was already made to follow up- Consuella LoseAnastasiya V Priest Lockridge, RMA

## 2017-06-29 NOTE — Telephone Encounter (Signed)
-----   Message from Maple Hudsonichard L Gilbert Jr., MD sent at 06/22/2017  2:26 PM EDT ----- Sorry, missed the lipids. Need to start Crestor 5 mg daily at bedtime. Recheck 1-2 months

## 2017-10-14 ENCOUNTER — Other Ambulatory Visit: Payer: Self-pay

## 2017-10-14 ENCOUNTER — Ambulatory Visit (INDEPENDENT_AMBULATORY_CARE_PROVIDER_SITE_OTHER): Payer: PRIVATE HEALTH INSURANCE | Admitting: Family Medicine

## 2017-10-14 VITALS — BP 122/6 | HR 68 | Temp 98.0°F | Resp 16 | Wt 179.0 lb

## 2017-10-14 DIAGNOSIS — E7800 Pure hypercholesterolemia, unspecified: Secondary | ICD-10-CM | POA: Diagnosis not present

## 2017-10-14 DIAGNOSIS — B372 Candidiasis of skin and nail: Secondary | ICD-10-CM

## 2017-10-14 DIAGNOSIS — E119 Type 2 diabetes mellitus without complications: Secondary | ICD-10-CM

## 2017-10-14 MED ORDER — NYSTATIN 100000 UNIT/GM EX CREA: 1.0000 "application " | TOPICAL_CREAM | Freq: Two times a day (BID) | CUTANEOUS | 0 refills | Status: DC

## 2017-10-14 NOTE — Progress Notes (Signed)
Jacqueline Romero  MRN: [id number] DOB: [birth year]  Subjective:  HPI  The patient is a 51 year old female who presents for follow up of her chronic illness.  She was last seen on 06/16/17 for her annual physical.  Diabetes-The patient had her A1C done at the time of her last visit and it was 7.7.  She checks her glucose at home and gets readings that range in the 130's to 140's.  She has had no symptoms suggestive of hypoglycemia.  Hyperlipidemia-On her last visit the patient was instructed to re-start her Crestor and have her labs checked at today's visit.  She c/o mildly rpuritic area under right breast.  Patient Active Problem List   Diagnosis Date Noted  . Epigastric abdominal pain 05/10/2017  . Vitamin D deficiency 06/19/2015  . Obesity 06/19/2015  . Hyperlipidemia 06/19/2015  . GERD (gastroesophageal reflux disease) 06/19/2015  . Diabetes mellitus, type 2 (HCC) 03/25/2015    Past Medical History:  Diagnosis Date  . Chronic kidney disease   . Diabetes mellitus without complication (HCC)   . Diverticulosis   . Duodenitis   . Heart murmur   . Migraine     Social History   Socioeconomic History  . Marital status: Married    Spouse name: Reuel BoomDaniel  . Number of children: 1  . Years of education: masters  . Highest education level: Not on file  Social Needs  . Financial resource strain: Not on file  . Food insecurity - worry: Not on file  . Food insecurity - inability: Not on file  . Transportation needs - medical: Not on file  . Transportation needs - non-medical: Not on file  Occupational History  . Occupation: Facilities managerteacher/ coach    Employer: [employer]  Tobacco Use  . Smoking status: Never Smoker  . Smokeless tobacco: Never Used  Substance and Sexual Activity  . Alcohol use: No  . Drug use: No  . Sexual activity: Yes    Partners: Male    Birth control/protection: Surgical  Other Topics Concern  . Not on file  Social History Narrative   Has one  granddaughter named " [name]" 52 years old (born 2016)    Outpatient Encounter Medications as of 10/14/2017  Medication Sig  . Cholecalciferol (VITAMIN D) 2000 UNITS CAPS Take 2,000 Units by mouth daily.  . cholestyramine (QUESTRAN) 4 GM/DOSE powder Take 1 packet (4 g total) by mouth 3 (three) times daily with meals.  . dapagliflozin propanediol (FARXIGA) 5 MG TABS tablet Take 5 mg by mouth daily.  . ferrous fumarate (HEMOCYTE - 106 MG FE) 325 (106 FE) MG TABS tablet Take 1 tablet by mouth daily.  Marland Kitchen. losartan (COZAAR) 25 MG tablet TAKE ONE TABLET BY MOUTH ONCE DAILY  . metFORMIN (GLUCOPHAGE) 1000 MG tablet TAKE ONE TABLET BY MOUTH TWICE DAILY  . pantoprazole (PROTONIX) 40 MG tablet Take by mouth.  . Psyllium (METAMUCIL) 48.57 % POWD Take by mouth.  . RABEprazole (ACIPHEX) 20 MG tablet Take by mouth.  . rosuvastatin (CRESTOR) 5 MG tablet Take 1 tablet (5 mg total) by mouth at bedtime.   No facility-administered encounter medications on file as of 10/14/2017.     Allergies  Allergen Reactions  . Penicillins Hives and Swelling  . Sulfur Hives    Review of Systems  Constitutional: Negative for fever and malaise/fatigue.  HENT: Negative.   Eyes: Negative.   Respiratory: Negative for cough, shortness of breath and wheezing.   Cardiovascular: Negative for chest  pain, palpitations, orthopnea, claudication and leg swelling.  Genitourinary: Negative for frequency.  Skin: Positive for itching.  Neurological: Positive for headaches (frontal). Negative for dizziness and weakness.  Endo/Heme/Allergies: Negative.  Negative for polydipsia.  Psychiatric/Behavioral: Negative.     Objective:  BP (!) 122/6 (BP Location: Right Arm, Patient Position: Sitting, Cuff Size: Normal)   Pulse 68   Temp 98 F (36.7 C) (Oral)   Resp 16   Wt 179 lb (81.2 kg)   BMI 28.89 kg/m   Physical Exam  Constitutional: She is oriented to person, place, and time and well-developed, well-nourished, and in no  distress.  HENT:  Head: Normocephalic and atraumatic.  Right Ear: External ear normal.  Left Ear: External ear normal.  Eyes: Conjunctivae are normal. No scleral icterus.  Neck: No thyromegaly present.  Cardiovascular: Normal rate, regular rhythm and normal heart sounds.  Pulmonary/Chest: Effort normal and breath sounds normal.  Abdominal: Soft.  Lymphadenopathy:    She has no cervical adenopathy.  Neurological: She is alert and oriented to person, place, and time. Gait normal. GCS score is 15.  Skin: Skin is warm and dry.  Possible healing monilia under righ lateral breast.  Psychiatric: Mood, memory and judgment normal.    Assessment and Plan :  1. Type 2 diabetes mellitus without complication, without long-term current use of insulin (HCC)  - Hemoglobin A1c  2. Pure hypercholesterolemia  - Lipid panel - COMPLETE METABOLIC PANEL WITH GFR  3. Monilial rash  - nystatin cream (MYCOSTATIN); Apply 1 application topically 2 (two) times daily.  Dispense: 30 g; Refill: 0  I have done the exam and reviewed the chart and it is accurate to the best of my knowledge. DentistDragon  technology has been used and  any errors in dictation or transcription are unintentional. Julieanne Mansonichard Gilbert M.D. [facility]

## 2017-10-15 LAB — HEMOGLOBIN A1C
EAG (MMOL/L): 9.5 (calc)
Hgb A1c MFr Bld: 7.6 % of total Hgb — ABNORMAL HIGH (ref <5.7)
Mean Plasma Glucose: 171 (calc)

## 2017-10-15 LAB — COMPLETE METABOLIC PANEL WITH GFR
AG Ratio: 1.9 (calc) (ref 1.0–2.5)
ALBUMIN MSPROF: 4.4 g/dL (ref 3.6–5.1)
ALKALINE PHOSPHATASE (APISO): 64 U/L (ref 33–130)
ALT: 7 U/L (ref 6–29)
AST: 9 U/L — ABNORMAL LOW (ref 10–35)
BILIRUBIN TOTAL: 0.5 mg/dL (ref 0.2–1.2)
BUN: 11 mg/dL (ref 7–25)
CHLORIDE: 108 mmol/L (ref 98–110)
CO2: 25 mmol/L (ref 20–32)
Calcium: 9.3 mg/dL (ref 8.6–10.4)
Creat: 0.73 mg/dL (ref 0.50–1.05)
GFR, Est African American: 110 mL/min/{1.73_m2} (ref >=60)
GFR, Est Non African American: 95 mL/min/{1.73_m2} (ref >=60)
GLOBULIN: 2.3 g/dL (ref 1.9–3.7)
GLUCOSE: 128 mg/dL — AB (ref 65–99)
Potassium: 3.9 mmol/L (ref 3.5–5.3)
SODIUM: 142 mmol/L (ref 135–146)
Total Protein: 6.7 g/dL (ref 6.1–8.1)

## 2017-10-15 LAB — LIPID PANEL
Cholesterol: 186 mg/dL (ref <200)
HDL: 74 mg/dL (ref >50)
LDL CHOLESTEROL (CALC): 98 mg/dL
Non-HDL Cholesterol (Calc): 112 mg/dL (calc) (ref <130)
TRIGLYCERIDES: 58 mg/dL (ref <150)
Total CHOL/HDL Ratio: 2.5 (calc) (ref <5.0)

## 2017-10-20 ENCOUNTER — Telehealth: Payer: Self-pay

## 2017-10-20 MED ORDER — ROSUVASTATIN CALCIUM 20 MG PO TABS: 20.0000 mg | ORAL_TABLET | Freq: Every day | ORAL | 3 refills | Status: DC

## 2017-10-20 NOTE — Telephone Encounter (Signed)
Pt advised and RX sent in-[name], RMA  

## 2017-10-20 NOTE — Telephone Encounter (Signed)
-----   Message from Maple Hudsonichard L Gilbert Jr., MD sent at 10/20/2017  8:29 AM EST ----- Stable--increase Crestor from 5 to 20mg  .

## 2017-11-08 ENCOUNTER — Other Ambulatory Visit: Payer: Self-pay | Admitting: Emergency Medicine

## 2017-11-08 DIAGNOSIS — B372 Candidiasis of skin and nail: Secondary | ICD-10-CM

## 2017-12-15 ENCOUNTER — Telehealth: Payer: Self-pay | Admitting: Family Medicine

## 2017-12-15 NOTE — Telephone Encounter (Signed)
FYI

## 2017-12-15 NOTE — Telephone Encounter (Signed)
FYI-- You placed an order for pt to be seen by dermatologist Jan 2019.[facility] has made an attempt to contact pt without return phone call.I have also spoken to pt to give her their contact information.She has not scheduled appointment

## 2018-01-15 ENCOUNTER — Other Ambulatory Visit: Payer: Self-pay | Admitting: Family Medicine

## 2018-01-15 DIAGNOSIS — E119 Type 2 diabetes mellitus without complications: Secondary | ICD-10-CM

## 2018-01-18 ENCOUNTER — Telehealth: Payer: Self-pay | Admitting: Emergency Medicine

## 2018-01-18 DIAGNOSIS — E119 Type 2 diabetes mellitus without complications: Secondary | ICD-10-CM

## 2018-01-18 MED ORDER — EMPAGLIFLOZIN 10 MG PO TABS: 10.0000 mg | ORAL_TABLET | Freq: Every day | ORAL | 11 refills | Status: DC

## 2018-01-18 NOTE — Telephone Encounter (Signed)
We can try Jardiance. [facility] in.   [facility] [place].

## 2018-01-18 NOTE — Telephone Encounter (Signed)
Marcelline DeistFarxiga is too expensive for pt. Switch to alternative? Please advise. Thanks.

## 2018-01-18 NOTE — Telephone Encounter (Signed)
Invokana or Jardiance

## 2018-02-10 ENCOUNTER — Ambulatory Visit: Payer: Self-pay | Admitting: Family Medicine

## 2018-02-22 ENCOUNTER — Encounter: Payer: Self-pay | Admitting: Certified Nurse Midwife

## 2018-02-23 ENCOUNTER — Other Ambulatory Visit: Payer: Self-pay | Admitting: Family Medicine

## 2018-02-23 DIAGNOSIS — Z1231 Encounter for screening mammogram for malignant neoplasm of breast: Secondary | ICD-10-CM

## 2018-03-07 ENCOUNTER — Ambulatory Visit: Payer: Managed Care, Other (non HMO) | Admitting: Family Medicine

## 2018-03-07 VITALS — BP 120/70 | HR 78 | Temp 98.3°F | Resp 16 | Wt 178.0 lb

## 2018-03-07 DIAGNOSIS — E119 Type 2 diabetes mellitus without complications: Secondary | ICD-10-CM | POA: Diagnosis not present

## 2018-03-07 NOTE — Progress Notes (Signed)
Jacqueline Romero  MRN: [id number] DOB: 10-21-1965  Subjective:  HPI  The patient is a 52 year old female who presents for follow up of her diabetes.  She was last seen on 10/14/17.  On that day her A1C was 7.6.  She was given a prescription for Judd Gaudier, however, she did not have insurance at the time and it was going to cost her over 400 dollars.  She will have insurance again starting next month and will try to get it at that time.  She has not been checking her glucose at home but states she has felt well.   The patient is also due for her TSH and CBC to be checked.   Patient Active Problem List   Diagnosis Date Noted  . Epigastric abdominal pain 05/10/2017  . Vitamin D deficiency 06/19/2015  . Obesity 06/19/2015  . Hyperlipidemia 06/19/2015  . GERD (gastroesophageal reflux disease) 06/19/2015  . Diabetes mellitus, type 2 (HCC) 03/25/2015    Past Medical History:  Diagnosis Date  . Chronic kidney disease   . Diabetes mellitus without complication (HCC)   . Diverticulosis   . Duodenitis   . Heart murmur   . Migraine     Social History   Socioeconomic History  . Marital status: Married    Spouse name: Reuel Boom  . Number of children: 1  . Years of education: masters  . Highest education level: Not on file  Occupational History  . Occupation: Facilities manager: [employer]  Social Needs  . Financial resource strain: Not on file  . Food insecurity:    Worry: Not on file    Inability: Not on file  . Transportation needs:    Medical: Not on file    Non-medical: Not on file  Tobacco Use  . Smoking status: Never Smoker  . Smokeless tobacco: Never Used  Substance and Sexual Activity  . Alcohol use: No  . Drug use: No  . Sexual activity: Yes    Partners: Male    Birth control/protection: Surgical  Lifestyle  . Physical activity:    Days per week: Not on file    Minutes per session: Not on file  . Stress: Not on file  Relationships  . Social  connections:    Talks on phone: Not on file    Gets together: Not on file    Attends religious service: Not on file    Active member of club or organization: Not on file    Attends meetings of clubs or organizations: Not on file    Relationship status: Not on file  . Intimate partner violence:    Fear of current or ex partner: Not on file    Emotionally abused: Not on file    Physically abused: Not on file    Forced sexual activity: Not on file  Other Topics Concern  . Not on file  Social History Narrative   Has one granddaughter named " [name]" 79 years old (born 2016)    Outpatient Encounter Medications as of 03/07/2018  Medication Sig  . Cholecalciferol (VITAMIN D) 2000 UNITS CAPS Take 2,000 Units by mouth daily.  . cholestyramine (QUESTRAN) 4 GM/DOSE powder Take 1 packet (4 g total) by mouth 3 (three) times daily with meals.  . empagliflozin (JARDIANCE) 10 MG TABS tablet Take 10 mg by mouth daily.  . ferrous fumarate (HEMOCYTE - 106 MG FE) 325 (106 FE) MG TABS tablet Take 1 tablet by mouth daily.  Marland Kitchen  losartan (COZAAR) 25 MG tablet TAKE ONE TABLET BY MOUTH ONCE DAILY  . metFORMIN (GLUCOPHAGE) 1000 MG tablet TAKE ONE TABLET BY MOUTH TWICE DAILY  . nystatin cream (MYCOSTATIN) Apply 1 application topically 2 (two) times daily.  . pantoprazole (PROTONIX) 40 MG tablet Take by mouth.  . Psyllium (METAMUCIL) 48.57 % POWD Take by mouth.  . RABEprazole (ACIPHEX) 20 MG tablet Take by mouth.  . rosuvastatin (CRESTOR) 20 MG tablet Take 1 tablet (20 mg total) by mouth at bedtime.  . [DISCONTINUED] FARXIGA 5 MG TABS tablet TAKE ONE TABLET BY MOUTH ONCE DAILY   No facility-administered encounter medications on file as of 03/07/2018.     Allergies  Allergen Reactions  . Penicillins Hives and Swelling  . Sulfur Hives    Review of Systems  Constitutional: Negative for fever and malaise/fatigue.  Respiratory: Negative for cough, shortness of breath and wheezing.   Cardiovascular: Negative  for chest pain, palpitations and leg swelling.  Genitourinary: Negative for urgency.  Neurological: Negative.   Endo/Heme/Allergies: Negative for polydipsia.  Psychiatric/Behavioral: Negative.     Objective:  BP 120/70 (BP Location: Right Arm, Patient Position: Sitting, Cuff Size: Normal)   Pulse 78   Temp 98.3 F (36.8 C) (Oral)   Resp 16   Wt 178 lb (80.7 kg)   BMI 28.73 kg/m   Physical Exam  Constitutional: She is oriented to person, place, and time and well-developed, well-nourished, and in no distress.  HENT:  Head: Normocephalic and atraumatic.  Eyes: Conjunctivae are normal. No scleral icterus.  Neck: No thyromegaly present.  Cardiovascular: Normal rate, regular rhythm and normal heart sounds.  Pulmonary/Chest: Effort normal and breath sounds normal.  Abdominal: Soft.  Neurological: She is alert and oriented to person, place, and time. Gait normal. GCS score is 15.  Skin: Skin is warm and dry.  Psychiatric: Mood, memory, affect and judgment normal.    Assessment and Plan :  1. Type 2 diabetes mellitus without complication, without long-term current use of insulin (HCC) RTC 4 months. - TSH - Hemoglobin A1c - CBC with Differential/Platelet  I have done the exam and reviewed the chart and it is accurate to the best of my knowledge. Dentist has been used and  any errors in dictation or transcription are unintentional. Julieanne Manson M.D. [facility]

## 2018-03-08 LAB — CBC WITH DIFFERENTIAL/PLATELET
BASOS ABS: 0 10*3/uL (ref 0.0–0.2)
Basos: 0 %
EOS (ABSOLUTE): 0.1 10*3/uL (ref 0.0–0.4)
EOS: 2 %
Hematocrit: 40.8 % (ref 34.0–46.6)
Hemoglobin: 13.6 g/dL (ref 11.1–15.9)
IMMATURE GRANULOCYTES: 0 %
Immature Grans (Abs): 0 10*3/uL (ref 0.0–0.1)
LYMPHS ABS: 2.6 10*3/uL (ref 0.7–3.1)
Lymphs: 49 %
MCH: 28.9 pg (ref 26.6–33.0)
MCHC: 33.3 g/dL (ref 31.5–35.7)
MCV: 87 fL (ref 79–97)
MONOCYTES: 8 %
MONOS ABS: 0.4 10*3/uL (ref 0.1–0.9)
Neutrophils Absolute: 2.1 10*3/uL (ref 1.4–7.0)
Neutrophils: 41 %
Platelets: 219 10*3/uL (ref 150–450)
RBC: 4.7 x10E6/uL (ref 3.77–5.28)
RDW: 13.1 % (ref 12.3–15.4)
WBC: 5.2 10*3/uL (ref 3.4–10.8)

## 2018-03-08 LAB — HEMOGLOBIN A1C
ESTIMATED AVERAGE GLUCOSE: 174 mg/dL
HEMOGLOBIN A1C: 7.7 % — AB (ref 4.8–5.6)

## 2018-03-08 LAB — TSH: TSH: 1.09 u[IU]/mL (ref 0.450–4.500)

## 2018-03-18 ENCOUNTER — Encounter: Payer: Self-pay | Admitting: Family Medicine

## 2018-04-13 ENCOUNTER — Ambulatory Visit
Admission: RE | Admit: 2018-04-13 | Discharge: 2018-04-13 | Disposition: A | Discharge status: Home / Self Care | Payer: Managed Care, Other (non HMO) | Source: Ambulatory Visit | Attending: Family Medicine | Admitting: Family Medicine

## 2018-04-13 DIAGNOSIS — Z1231 Encounter for screening mammogram for malignant neoplasm of breast: Secondary | ICD-10-CM | POA: Diagnosis present

## 2018-05-05 NOTE — Progress Notes (Signed)
Gynecology Annual Exam  PCP: Maple Hudson., MD  Chief Complaint:  Chief Complaint  Patient presents with  . Gynecologic Exam    History of Present Illness: Jacqueline Romero is a 52 y.o. G1P1001 who presents for her annual exam. The patient denies any significant gyn concerns except a increased vaginal discharge since last week. Denies vaginal odor or vulvovaginal irritation. Since her last annual 05/05/2017, she has had an evaluation of upper abdominal and lower back pain. She had a negative CT scan of the abdomen and a negative abdominal ultrasound. She had a EGD done in 2016 which showed duodenitis. Colonoscopy in 2016 showed diverticulosis.   Her menses are absent, since her [facility] in 2009 for AUB and fibroids. Last pap smear: 03/06/2016, results were NIL The patient is sexually active. She does not have dyspareunia.   Her past medical history is remarkable for diabetes II, hypertension, anemia, seasonal allergies, GERD and chronic constipation.   The patient does perform self breast exams. Her last mammogram was 04/13/2018, results were negative.   Her sister was diagnosed with early breast cancer this year at the age of 15 (had lumpectomy and radiation). Genetic testing has not been done.  There is no family history of ovarian cancer. Mother died from pancreatic cancer  The patient denies smoking.  She denies drinking alcohol   She denies illegal drug use.  The patient exercises by walking 3x/week x 30 min  The patient denies current symptoms of depression.    Last cholesterol screening was in 09/2017 and was normal. Her hemoglobin A1C was 7.7% in 02/2018  Review of Systems: Review of Systems  Constitutional: Negative for chills, fever and weight loss (10# weight loss).  HENT: Positive for sore throat (and hoarseness-was seeing ENT). Negative for congestion and sinus pain.   Eyes: Negative for blurred vision and pain.  Respiratory: Negative for hemoptysis,  shortness of breath and wheezing.   Cardiovascular: Negative for chest pain, palpitations and leg swelling.  Gastrointestinal: Positive for abdominal pain (upper abdominal), constipation and heartburn. Negative for blood in stool, diarrhea, nausea and vomiting.       Positive for feeling full fast, worse upper abdominal pain after eating  Genitourinary: Negative for dysuria, frequency, hematuria and urgency.  Musculoskeletal: Positive for back pain. Negative for joint pain and myalgias.  Skin: Negative for itching and rash.  Neurological: Positive for headaches. Negative for dizziness and tingling.  Endo/Heme/Allergies: Positive for environmental allergies. Negative for polydipsia. Does not bruise/bleed easily.       Negative for hirsutism   Psychiatric/Behavioral: Negative for depression. The patient is not nervous/anxious and does not have insomnia.     Past Medical History:  Past Medical History:  Diagnosis Date  . Anemia   . Chronic kidney disease   . Diabetes mellitus without complication (HCC)   . Diverticulosis   . Duodenitis   . Heart murmur   . Hypertension   . Migraine     Past Surgical History:  Past Surgical History:  Procedure Laterality Date  . CHOLECYSTECTOMY     laparscopic-Dr Byrnett  . COLONOSCOPY WITH PROPOFOL N/A 04/15/2015   Procedure: COLONOSCOPY WITH PROPOFOL;  Surgeon: Christena Deem, MD;  Location: [facility] ENDOSCOPY;  Service: Endoscopy;  Laterality: N/A;  . ESOPHAGOGASTRODUODENOSCOPY N/A 04/15/2015   Procedure: ESOPHAGOGASTRODUODENOSCOPY (EGD);  Surgeon: Christena Deem, MD;  Location: [facility] ENDOSCOPY;  Service: Endoscopy;  Laterality: N/A;  . KNEE ARTHROSCOPY W/ SYNOVECTOMY Left   . LAPAROSCOPIC SUPRACERVICAL  HYSTERECTOMY  2009   fibroids/AUB  . TUBAL LIGATION Bilateral     Family History:  Family History  Problem Relation Age of Onset  . Pancreatic cancer Mother [id number]  . Throat cancer Father   . Hypertension Father   . Hypertension Sister   .  Diabetes Sister   . Breast cancer Sister [id number]  . Hypertension Brother   . Diabetes Maternal Aunt   . Hypertension Maternal Aunt   . Hypertension Maternal Uncle   . Diabetes Maternal Uncle     Social History:  Social History   Socioeconomic History  . Marital status: Married    Spouse name: Reuel BoomDaniel  . Number of children: 1  . Years of education: masters  . Highest education level: Not on file  Occupational History  . Occupation: Facilities managerteacher/ coach    Employer: [employer]  Social Needs  . Financial resource strain: Not on file  . Food insecurity:    Worry: Not on file    Inability: Not on file  . Transportation needs:    Medical: Not on file    Non-medical: Not on file  Tobacco Use  . Smoking status: Never Smoker  . Smokeless tobacco: Never Used  Substance and Sexual Activity  . Alcohol use: No  . Drug use: No  . Sexual activity: Yes    Partners: Male    Birth control/protection: Surgical  Lifestyle  . Physical activity:    Days per week: Not on file    Minutes per session: Not on file  . Stress: Not on file  Relationships  . Social connections:    Talks on phone: Not on file    Gets together: Not on file    Attends religious service: Not on file    Active member of club or organization: Not on file    Attends meetings of clubs or organizations: Not on file    Relationship status: Not on file  . Intimate partner violence:    Fear of current or ex partner: Not on file    Emotionally abused: Not on file    Physically abused: Not on file    Forced sexual activity: Not on file  Other Topics Concern  . Not on file  Social History Narrative   Has one granddaughter named " [name]" 53 years old (born 2016)    Allergies:  Allergies  Allergen Reactions  . Penicillins Hives and Swelling  . Sulfur Hives    Medications:  Current Outpatient Medications:  .  Cholecalciferol (VITAMIN D) 2000 UNITS CAPS, Take 2,000 Units by mouth daily., Disp: , Rfl:  .  ferrous  fumarate (HEMOCYTE - 106 MG FE) 325 (106 FE) MG TABS tablet, Take 1 tablet by mouth daily., Disp: , Rfl:  .  losartan (COZAAR) 25 MG tablet, TAKE ONE TABLET BY MOUTH ONCE DAILY, Disp: 90 tablet, Rfl: 0 .  metFORMIN (GLUCOPHAGE) 1000 MG tablet, TAKE ONE TABLET BY MOUTH TWICE DAILY, Disp: 180 tablet, Rfl: 0 .  Multiple Vitamins-Minerals (WOMENS MULTIVITAMIN PLUS PO), Take 1 tablet by mouth daily., Disp: , Rfl:  .  pantoprazole (PROTONIX) 40 MG tablet, Take by mouth., Disp: , Rfl:  Will start [place] when she gets insurance Physical Exam Vitals:BP 140/80   Pulse 68   Ht 5\' 6"  (1.676 m)   Wt 79.6 kg (175 lb 8 oz)   BMI 28.33 kg/m  General: pleasant BF in  NAD HEENT: normocephalic, anicteric Neck: no thyroid enlargement, no palpable nodules, no cervical lymphadenopathy  Pulmonary: No increased work of breathing, CTAB Cardiovascular: RRR, without murmur  Breast: Breast symmetrical, no tenderness, no palpable nodules or masses, no skin or nipple retraction present, no nipple discharge.  No axillary, infraclavicular or supraclavicular lymphadenopathy. Abdomen: Soft, non-distended, mildly tender in upper abdomen.  Umbilicus without lesions.  No hepatomegaly or masses palpable. No evidence of hernia. Genitourinary:  External: Normal external female genitalia.  Normal urethral meatus, normal Bartholin's and Skene's glands.    Vagina: Normal vaginal mucosa, no evidence of prolapse.    Cervix: Grossly normal in appearance, no bleeding, non-tender  Uterus: surgically absent  Adnexa: No adnexal masses, non-tender  Rectal: deferred  Lymphatic: no evidence of inguinal lymphadenopathy Extremities: no edema, erythema, or tenderness Neurologic: Grossly intact Psychiatric: mood appropriate, affect full Results for orders placed or performed in visit on 05/06/18 (from the past 72 hour(s))  POCT Wet Prep Mellody [place])     Status: Abnormal   Collection Time: 05/08/18  9:07 PM  Result Value Ref Range    Source Wet Prep POC vagina    WBC, Wet Prep HPF POC     Bacteria Wet Prep HPF POC Many (A) Few   BACTERIA WET PREP MORPHOLOGY POC     Clue Cells Wet Prep HPF POC Moderate (A) None   Clue Cells Wet Prep Whiff POC     Yeast Wet Prep HPF POC None None   KOH Wet Prep POC  None   Trichomonas Wet Prep HPF POC Absent Absent    Assessment: 52 y.o. G1P1001 annual gyn exam Bacterial vaginosis Family history of breast and pancreatic cancer  Plan:   1) Breast cancer screening - recommend monthly self breast exam and annual mammograms Mammogram is up to date. Will check to see if she qualifies for genetic testing.  2) Cervical cancer screening - Pap was done. ASCCP guidelines and rational discussed.  Patient opts for yearly screening interval  3) Colon cancer screening-Colonoscopy done 2016  4) Routine healthcare maintenance including cholesterol and diabetes screening managed by PCP   5) Tindamax 1 GM daily x 5 days (no alcohol)  6) RTO for annual in 1 year.  Farrel Conners, CNM

## 2018-05-06 ENCOUNTER — Ambulatory Visit (INDEPENDENT_AMBULATORY_CARE_PROVIDER_SITE_OTHER): Payer: Managed Care, Other (non HMO) | Admitting: Certified Nurse Midwife

## 2018-05-06 ENCOUNTER — Other Ambulatory Visit (HOSPITAL_COMMUNITY)
Admission: RE | Admit: 2018-05-06 | Discharge: 2018-05-06 | Disposition: A | Discharge status: Home / Self Care | Payer: Managed Care, Other (non HMO) | Source: Ambulatory Visit | Attending: Certified Nurse Midwife | Admitting: Certified Nurse Midwife

## 2018-05-06 VITALS — BP 140/80 | HR 68 | Ht 66.0 in | Wt 175.5 lb

## 2018-05-06 DIAGNOSIS — Z01411 Encounter for gynecological examination (general) (routine) with abnormal findings: Secondary | ICD-10-CM

## 2018-05-06 DIAGNOSIS — B9689 Other specified bacterial agents as the cause of diseases classified elsewhere: Secondary | ICD-10-CM

## 2018-05-06 DIAGNOSIS — Z124 Encounter for screening for malignant neoplasm of cervix: Secondary | ICD-10-CM

## 2018-05-06 DIAGNOSIS — N760 Acute vaginitis: Secondary | ICD-10-CM

## 2018-05-06 DIAGNOSIS — R12 Heartburn: Secondary | ICD-10-CM

## 2018-05-06 DIAGNOSIS — R1010 Upper abdominal pain, unspecified: Secondary | ICD-10-CM

## 2018-05-06 DIAGNOSIS — Z01419 Encounter for gynecological examination (general) (routine) without abnormal findings: Secondary | ICD-10-CM

## 2018-05-06 DIAGNOSIS — N898 Other specified noninflammatory disorders of vagina: Secondary | ICD-10-CM

## 2018-05-06 DIAGNOSIS — M549 Dorsalgia, unspecified: Secondary | ICD-10-CM

## 2018-05-06 DIAGNOSIS — R51 Headache: Secondary | ICD-10-CM | POA: Diagnosis not present

## 2018-05-06 MED ORDER — TINIDAZOLE 500 MG PO TABS: 1000.0000 mg | ORAL_TABLET | Freq: Every day | ORAL | 0 refills | Status: AC

## 2018-05-08 ENCOUNTER — Encounter: Payer: Self-pay | Admitting: Certified Nurse Midwife

## 2018-05-08 DIAGNOSIS — D649 Anemia, unspecified: Secondary | ICD-10-CM | POA: Insufficient documentation

## 2018-05-08 DIAGNOSIS — I10 Essential (primary) hypertension: Secondary | ICD-10-CM | POA: Insufficient documentation

## 2018-05-08 DIAGNOSIS — E1159 Type 2 diabetes mellitus with other circulatory complications: Secondary | ICD-10-CM | POA: Insufficient documentation

## 2018-05-08 LAB — POCT WET PREP (WET MOUNT): Trichomonas Wet Prep HPF POC: ABSENT

## 2018-05-10 LAB — CYTOLOGY - PAP
DIAGNOSIS: NEGATIVE
HPV (WINDOPATH): NOT DETECTED

## 2018-07-07 ENCOUNTER — Ambulatory Visit: Payer: Self-pay | Admitting: Family Medicine

## 2018-07-14 ENCOUNTER — Encounter: Payer: Self-pay | Admitting: Family Medicine

## 2018-07-14 ENCOUNTER — Ambulatory Visit (INDEPENDENT_AMBULATORY_CARE_PROVIDER_SITE_OTHER): Payer: Self-pay | Admitting: Family Medicine

## 2018-07-14 VITALS — BP 110/70 | HR 70 | Temp 98.3°F | Resp 16 | Wt 174.4 lb

## 2018-07-14 DIAGNOSIS — K5904 Chronic idiopathic constipation: Secondary | ICD-10-CM

## 2018-07-14 DIAGNOSIS — E119 Type 2 diabetes mellitus without complications: Secondary | ICD-10-CM

## 2018-07-14 DIAGNOSIS — K219 Gastro-esophageal reflux disease without esophagitis: Secondary | ICD-10-CM

## 2018-07-14 DIAGNOSIS — I10 Essential (primary) hypertension: Secondary | ICD-10-CM

## 2018-07-14 DIAGNOSIS — E7800 Pure hypercholesterolemia, unspecified: Secondary | ICD-10-CM

## 2018-07-14 NOTE — Patient Instructions (Signed)
Take over the counter Glycolax daily

## 2018-07-14 NOTE — Progress Notes (Signed)
Patient: Jacqueline Romero Female    DOB: 1966-08-17   52 y.o.   MRN: [id number] Visit Date: 07/14/2018  Today's Provider: [name], MD   Chief Complaint  Patient presents with  . Diabetes   Subjective:    HPI    Pt is married,Mother of 1,GM of 2. She is getting ready to start a new job.  Diabetes Mellitus Type II, Follow-up:   Lab Results  Component Value Date   HGBA1C 7.7 (H) 03/07/2018   HGBA1C 7.6 (H) 10/14/2017   HGBA1C 7.7 (H) 06/16/2017    Last seen for diabetes 4 months ago.  Management since then includes None. She reports excellent compliance with treatment. She is not having side effects.  Current symptoms include none and have been stable. Home blood sugar records: not actively being checked  Episodes of hypoglycemia? no   Current Insulin Regimen: N/A Most Recent Eye Exam: 06/2017 Weight trend: stable Prior visit with dietician:  Current diet: in general, a "healthy" diet   Current exercise: walking  Pertinent Labs:    Component Value Date/Time   CHOL 186 10/14/2017 1214   CHOL 195 06/16/2017 1122   CHOL 158 10/31/2012 1255   TRIG 58 10/14/2017 1214   TRIG 48 10/31/2012 1255   HDL 74 10/14/2017 1214   HDL 64 06/16/2017 1122   HDL 63 (H) 10/31/2012 1255   LDLCALC 98 10/14/2017 1214   LDLCALC 85 10/31/2012 1255   CREATININE 0.73 10/14/2017 1214    Wt Readings from Last 3 Encounters:  07/14/18 174 lb 6.4 oz (79.1 kg)  05/06/18 175 lb 8 oz (79.6 kg)  03/07/18 178 lb (80.7 kg)    ------------------------------------------------------------------------   Allergies  Allergen Reactions  . Penicillins Hives and Swelling  . Sulfur Hives     Current Outpatient Medications:  .  Cholecalciferol (VITAMIN D) 2000 UNITS CAPS, Take 2,000 Units by mouth daily., Disp: , Rfl:  .  FARXIGA 5 MG TABS tablet, Take 5 mg by mouth daily., Disp: , Rfl: 11 .  ferrous fumarate (HEMOCYTE - 106 MG FE) 325 (106 FE) MG TABS tablet, Take 1  tablet by mouth daily., Disp: , Rfl:  .  losartan (COZAAR) 25 MG tablet, TAKE ONE TABLET BY MOUTH ONCE DAILY, Disp: 90 tablet, Rfl: 0 .  metFORMIN (GLUCOPHAGE) 1000 MG tablet, TAKE ONE TABLET BY MOUTH TWICE DAILY, Disp: 180 tablet, Rfl: 0 .  Multiple Vitamins-Minerals (WOMENS MULTIVITAMIN PLUS PO), Take 1 tablet by mouth daily., Disp: , Rfl:  .  pantoprazole (PROTONIX) 40 MG tablet, Take by mouth., Disp: , Rfl:  .  pravastatin (PRAVACHOL) 10 MG tablet, pravastatin 10 mg tablet, Disp: , Rfl:   Review of Systems  Constitutional: Negative.   HENT: Negative.   Eyes: Negative.   Respiratory: Negative.   Gastrointestinal: Positive for constipation.  Endocrine: Negative.   Genitourinary: Negative.   Musculoskeletal: Negative.   Neurological: Negative.     Social History   Tobacco Use  . Smoking status: Never Smoker  . Smokeless tobacco: Never Used  Substance Use Topics  . Alcohol use: No   Objective:   BP 110/70   Pulse 70   Temp 98.3 F (36.8 C) (Oral)   Resp 16   Wt 174 lb 6.4 oz (79.1 kg)   SpO2 97%   BMI 28.15 kg/m  Vitals:   07/14/18 1547  BP: 110/70  Pulse: 70  Resp: 16  Temp: 98.3 F (36.8 C)  TempSrc: Oral  SpO2: 97%  Weight: 174 lb 6.4 oz (79.1 kg)     Physical Exam  Constitutional: She is oriented to person, place, and time. She appears well-developed and well-nourished.  HENT:  Head: Normocephalic.  Right Ear: External ear normal.  Left Ear: External ear normal.  Nose: Nose normal.  Mouth/Throat: Oropharynx is clear and moist.  Eyes: Conjunctivae are normal. No scleral icterus.  Neck: No thyromegaly present.  Cardiovascular: Normal rate, regular rhythm and normal heart sounds.  Pulmonary/Chest: Effort normal and breath sounds normal.  Abdominal: Soft.  Musculoskeletal: She exhibits no edema.  Lymphadenopathy:    She has no cervical adenopathy.  Neurological: She is alert and oriented to person, place, and time.  Normal diabetic monofilament exam  of feet.  Skin: Skin is warm and dry.  Psychiatric: She has a normal mood and affect. Her behavior is normal. Judgment and thought content normal.        Assessment & Plan:     1. Pure hypercholesterolemia  - Lipid panel  2. Type 2 diabetes mellitus without complication, without long-term current use of insulin (HCC)  - HgB A1c  3. Essential hypertension  - CBC with Differential/Platelet - Comprehensive metabolic panel - TSH  4. Gastroesophageal reflux disease, esophagitis presence not specified   5. Chronic idiopathic constipation Try Glycolax daily. Colonoscopy 2016.     I have done the exam and reviewed the above chart and it is accurate to the best of my knowledge. Dentist has been used in this note in any air is in the dictation or transcription are unintentional.  [name], MD  [facility]

## 2018-07-17 DIAGNOSIS — K5900 Constipation, unspecified: Secondary | ICD-10-CM | POA: Insufficient documentation

## 2018-08-02 LAB — COMPREHENSIVE METABOLIC PANEL
A/G RATIO: 2 (ref 1.2–2.2)
ALT: 7 IU/L (ref 0–32)
AST: 13 IU/L (ref 0–40)
Albumin: 4.5 g/dL (ref 3.5–5.5)
Alkaline Phosphatase: 67 IU/L (ref 39–117)
BUN / CREAT RATIO: 14 (ref 9–23)
BUN: 13 mg/dL (ref 6–24)
Bilirubin Total: 0.2 mg/dL (ref 0.0–1.2)
CO2: 23 mmol/L (ref 20–29)
CREATININE: 0.9 mg/dL (ref 0.57–1.00)
Calcium: 9 mg/dL (ref 8.7–10.2)
Chloride: 105 mmol/L (ref 96–106)
GFR calc Af Amer: 85 mL/min/{1.73_m2} (ref >59)
GFR, EST NON AFRICAN AMERICAN: 74 mL/min/{1.73_m2} (ref >59)
GLUCOSE: 149 mg/dL — AB (ref 65–99)
Globulin, Total: 2.2 g/dL (ref 1.5–4.5)
Potassium: 4.2 mmol/L (ref 3.5–5.2)
SODIUM: 144 mmol/L (ref 134–144)
TOTAL PROTEIN: 6.7 g/dL (ref 6.0–8.5)

## 2018-08-02 LAB — CBC WITH DIFFERENTIAL/PLATELET
Basophils Absolute: 0 10*3/uL (ref 0.0–0.2)
Basos: 1 %
EOS (ABSOLUTE): 0.1 10*3/uL (ref 0.0–0.4)
Eos: 2 %
Hematocrit: 41.1 % (ref 34.0–46.6)
Hemoglobin: 13.7 g/dL (ref 11.1–15.9)
IMMATURE GRANS (ABS): 0 10*3/uL (ref 0.0–0.1)
Immature Granulocytes: 0 %
LYMPHS: 48 %
Lymphocytes Absolute: 1.9 10*3/uL (ref 0.7–3.1)
MCH: 29.4 pg (ref 26.6–33.0)
MCHC: 33.3 g/dL (ref 31.5–35.7)
MCV: 88 fL (ref 79–97)
MONOS ABS: 0.4 10*3/uL (ref 0.1–0.9)
Monocytes: 9 %
NEUTROS ABS: 1.6 10*3/uL (ref 1.4–7.0)
NEUTROS PCT: 40 %
Platelets: 217 10*3/uL (ref 150–450)
RBC: 4.66 x10E6/uL (ref 3.77–5.28)
RDW: 12.2 % — AB (ref 12.3–15.4)
WBC: 3.9 10*3/uL (ref 3.4–10.8)

## 2018-08-02 LAB — LIPID PANEL
CHOL/HDL RATIO: 3.6 ratio (ref 0.0–4.4)
Cholesterol, Total: 193 mg/dL (ref 100–199)
HDL: 53 mg/dL (ref >39)
LDL CALC: 129 mg/dL — AB (ref 0–99)
Triglycerides: 53 mg/dL (ref 0–149)
VLDL Cholesterol Cal: 11 mg/dL (ref 5–40)

## 2018-08-02 LAB — HEMOGLOBIN A1C
Est. average glucose Bld gHb Est-mCnc: 171 mg/dL
HEMOGLOBIN A1C: 7.6 % — AB (ref 4.8–5.6)

## 2018-08-02 LAB — TSH: TSH: 1.33 u[IU]/mL (ref 0.450–4.500)

## 2018-08-04 ENCOUNTER — Telehealth: Payer: Self-pay

## 2018-08-04 NOTE — Telephone Encounter (Signed)
Pt advised.   Thanks,   -Dorinne Graeff  

## 2018-08-04 NOTE — Telephone Encounter (Signed)
-----   Message from Maple Hudson., MD sent at 08/04/2018  8:20 AM EDT ----- Labs,including DM stable--

## 2018-11-09 ENCOUNTER — Telehealth: Payer: Self-pay | Admitting: Family Medicine

## 2018-11-09 NOTE — Telephone Encounter (Signed)
Patient dropped off form to be filed out. Last OV 07/14/18

## 2018-11-10 NOTE — Telephone Encounter (Signed)
Message left telling the pateint the form is available for pick up.

## 2019-01-12 ENCOUNTER — Ambulatory Visit: Payer: Self-pay | Admitting: Family Medicine

## 2019-01-15 IMAGING — MG MM DIGITAL SCREENING BILAT W/ CAD
4 series · 4 of 4 positions shown · non-contrast
Comparison: Previous exam(s).

CLINICAL DATA: Screening.

EXAM:
DIGITAL SCREENING BILATERAL MAMMOGRAM WITH CAD

[R MLO]
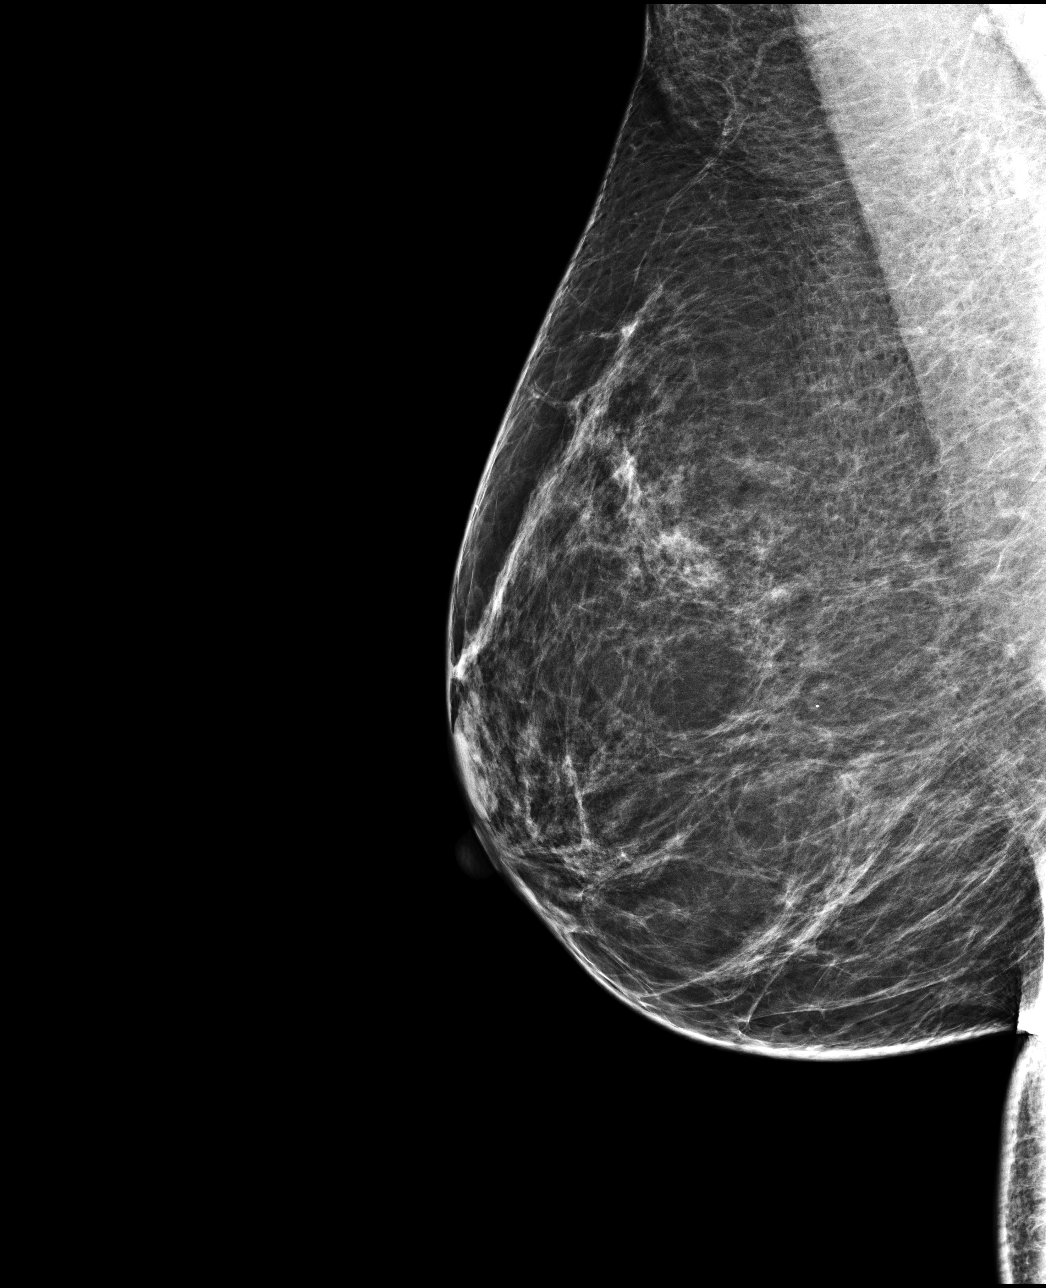

[L MLO]
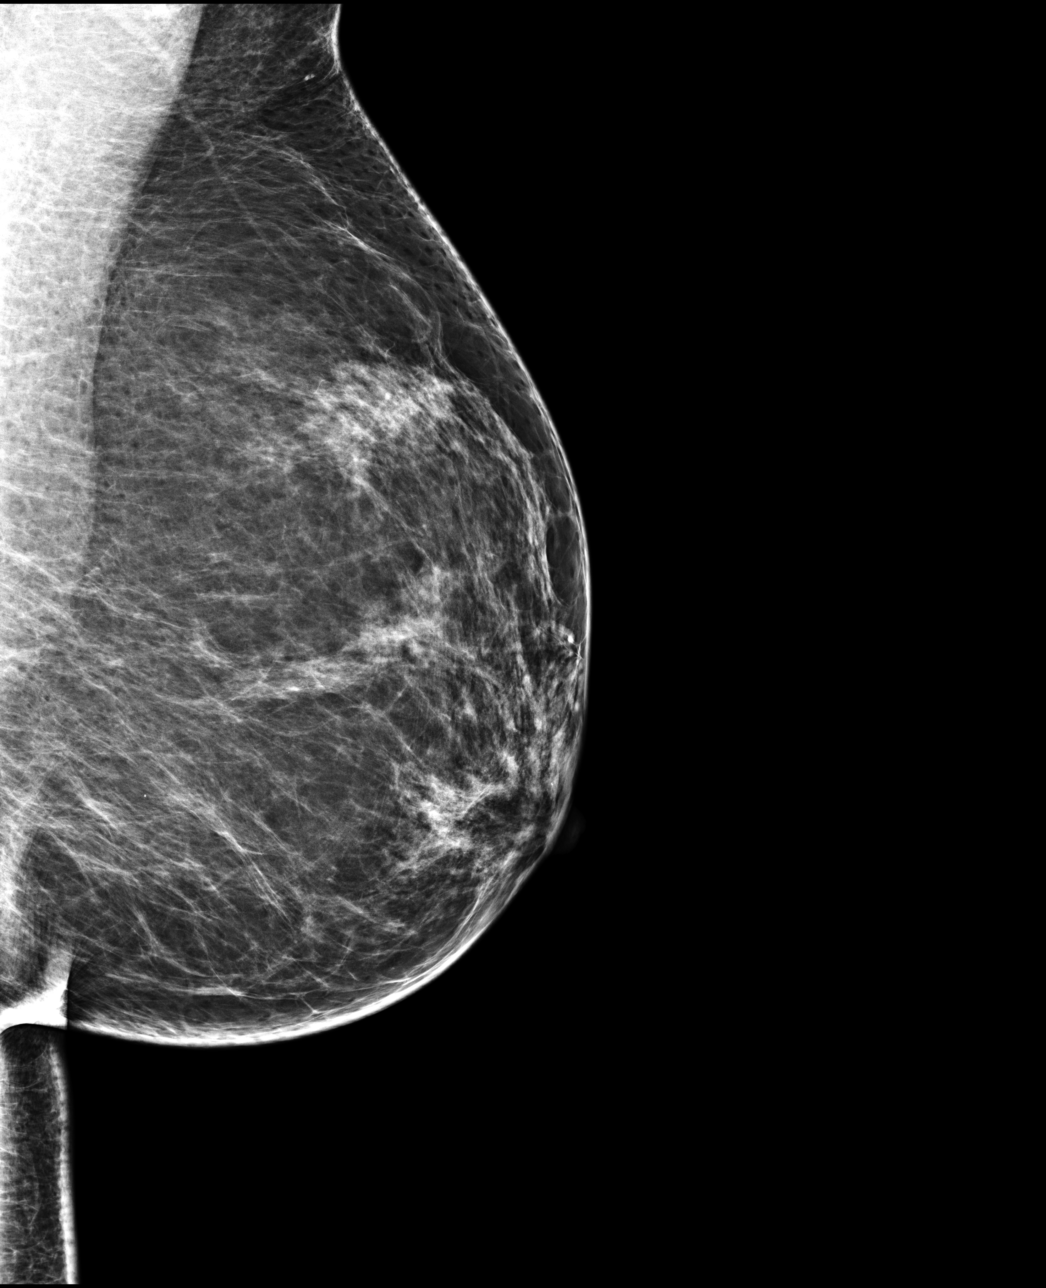

[R CC]
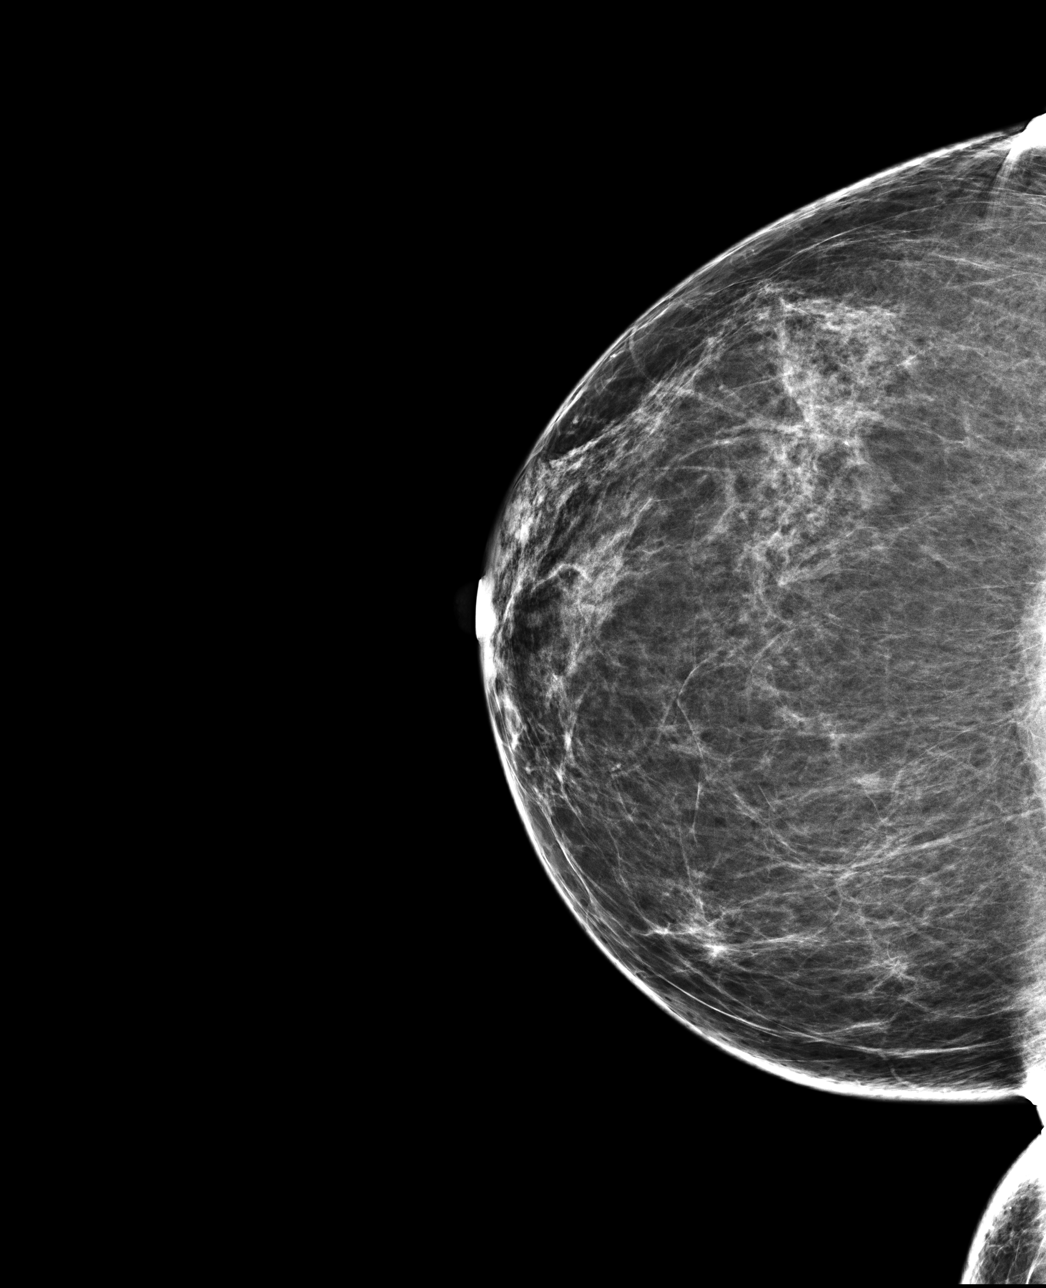

[L CC]
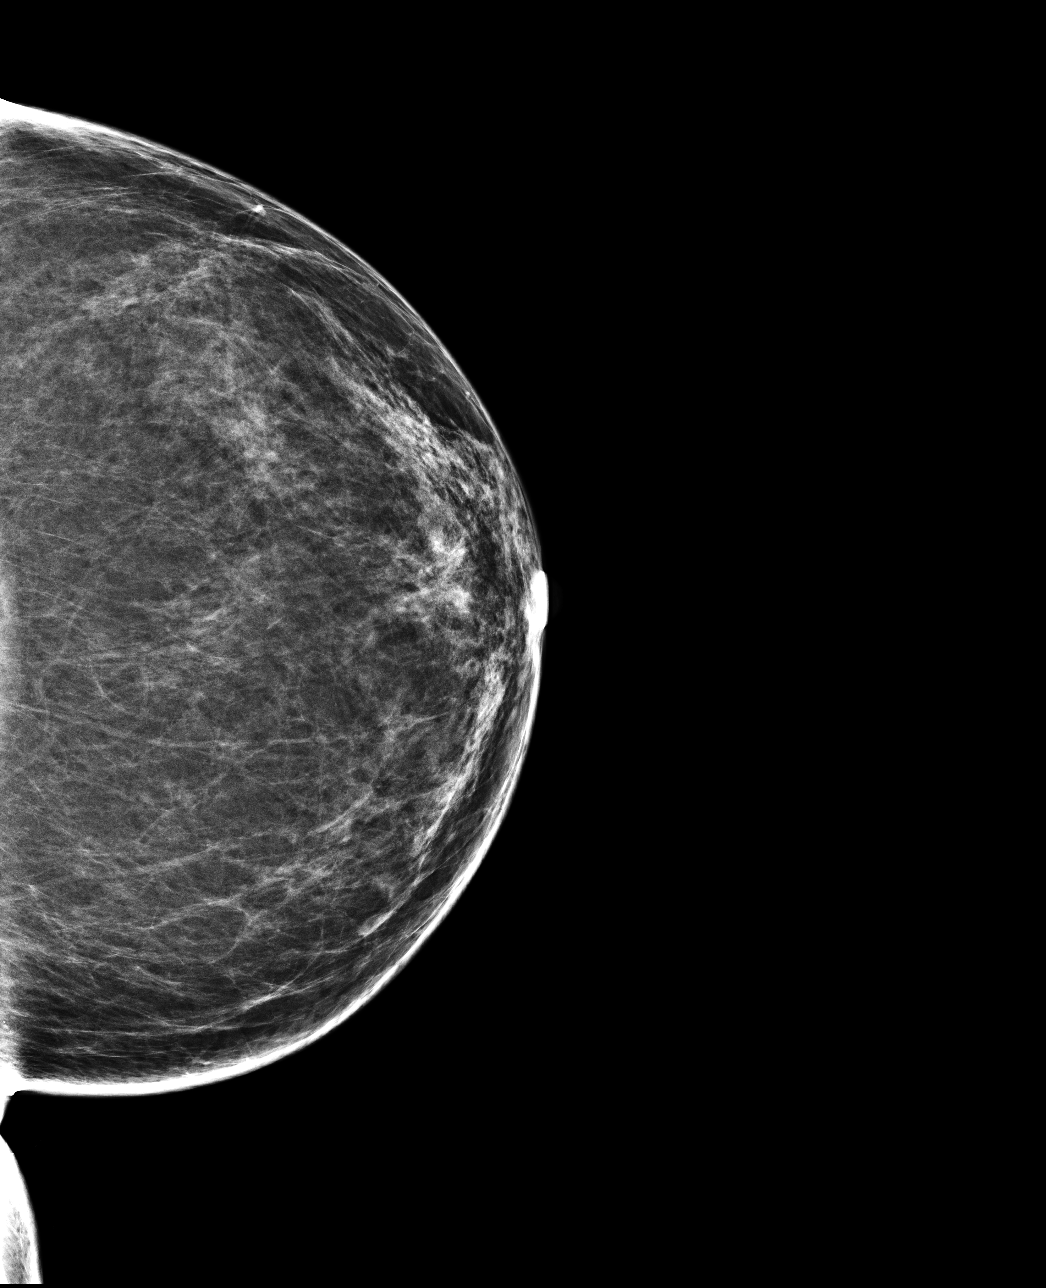

[4 of 4 positions shown; findings below may reference images not displayed]

ACR Breast Density Category b: There are scattered areas of
fibroglandular density.
FINDINGS: There are no findings suspicious for malignancy. Images were
processed with CAD.
IMPRESSION: No mammographic evidence of malignancy. A result letter of this
screening mammogram will be mailed directly to the patient.

RECOMMENDATION:
Screening mammogram in one year. (Code:AS-G-LCT)

BI-RADS CATEGORY  1: Negative.

## 2019-01-23 ENCOUNTER — Encounter: Payer: Self-pay | Admitting: Family Medicine

## 2019-03-01 ENCOUNTER — Other Ambulatory Visit: Payer: Self-pay | Admitting: Family Medicine

## 2019-03-18 ENCOUNTER — Telehealth: Payer: PRIVATE HEALTH INSURANCE | Admitting: Nurse Practitioner

## 2019-03-18 DIAGNOSIS — H00011 Hordeolum externum right upper eyelid: Secondary | ICD-10-CM | POA: Diagnosis not present

## 2019-03-18 MED ORDER — POLYMYXIN B-TRIMETHOPRIM 10000-0.1 UNIT/ML-% OP SOLN: 2.0000 [drp] | OPHTHALMIC | 0 refills | Status: DC

## 2019-03-18 NOTE — Progress Notes (Signed)
We are sorry that you are not feeling well. Here is how we plan to help! ° °Based on what you have shared with me it looks like you have a stye.  A stye is an inflammation of the eyelid.  It is often a red, painful lump near the edge of the eyelid that may look like a boil or a pimple.  A stye develops when an infection occurs at the base of an eyelash.  ° °We have made appropriate suggestions for you based upon your presentation: °Your symptoms may indicate an infection of the sclera.  The use of anti-inflammatory and antibiotic eye drops for a week will help resolve this condition.  I have sent in neomycin-polymyxin HC opthalmic suspension, two to three drops in the affected eye every 4 hours.  If your symptoms do not improve over the next two to three days you should be seen in your doctor's office. ° °HOME CARE: ° °· Wash your hands often! °· Let the stye open on its own. Don't squeeze or open it. °· Don't rub your eyes. This can irritate your eyes and let in bacteria.  If you need to touch your eyes, wash your hands first. °· Don't wear eye makeup or contact lenses until the area has healed. ° °GET HELP RIGHT AWAY IF: ° °· Your symptoms do not improve. °· You develop blurred or loss of vision. °· Your symptoms worsen (increased discharge, pain or redness). ° °Thank you for choosing an e-visit. ° °Your e-visit answers were reviewed by a board certified advanced clinical practitioner to complete your personal care plan.  Depending upon the condition, your plan could have included both over the counter or prescription medications. ° °Please review your pharmacy choice.  Make sure the pharmacy is open so you can pick up prescription now.  If there is a problem, you may contact your provider through MyChart messaging and have the prescription routed to another pharmacy.   ° °Your safety is important to us.  If you have drug allergies check your prescription carefully. ° °For the next 24 hours you can use MyChart to  ask questions about today's visit, request a non-urgent call back, or ask for a work or school excuse. ° °You will get an email in the next two days asking about your experience.  I hope you that your e-visit has been valuable and will speed your recovery. ° °5-10 minutes spent reviewing and documenting in chart. ° °

## 2019-03-20 ENCOUNTER — Telehealth: Payer: Self-pay

## 2019-03-20 NOTE — Telephone Encounter (Signed)
Patient called during lunch hours wanting to do an OV with Dr. Sullivan Lone for her and her son. Dr. Sullivan Lone is willing to see them tomorrow at 1pm for an Doxy.me visit.   Left message to call back to see if they can be available.

## 2019-03-24 NOTE — Telephone Encounter (Signed)
Unable to contact the patient. Will save message to the chart.  

## 2019-04-12 ENCOUNTER — Other Ambulatory Visit: Payer: Self-pay | Admitting: Family Medicine

## 2019-04-12 DIAGNOSIS — Z1231 Encounter for screening mammogram for malignant neoplasm of breast: Secondary | ICD-10-CM

## 2019-05-09 NOTE — Progress Notes (Signed)
Gynecology Annual Exam  PCP: [name]., MD  Chief Complaint:  Chief Complaint  Patient presents with  . Gynecologic Exam    History of Present Illness: Jacqueline Romero is a 53 y.o. G1P1001 who presents for her annual exam. The patient denies any significant gyn concerns except an intermittent vaginal discharge. Denies vaginal odor or vulvovaginal irritation. Since her last annual 05/06/18, she has had no significant changes in her health. She has had some right sternal border tenderness, not present currently. She also started a work up for hoarseness/ sore throat a few years ago. Had lost insurance and did not return for follow up.Her father had throat cancer. She had a EGD done in 2016 which showed duodenitis. Colonoscopy in 2016 showed diverticulosis.   Her menses are absent, since her [facility] in 2009 for AUB and fibroids. Last pap smear: 05/06/18, results were NIL/negative HRHPV The patient is sexually active. She does not have dyspareunia.   Her past medical history is remarkable for diabetes II, hypertension, anemia, seasonal allergies, GERD and chronic constipation.   The patient does perform self breast exams. Her last mammogram was 04/13/2018, results were negative.   Her sister was diagnosed with early breast cancer last year at the age of 39 (had lumpectomy and radiation). Genetic testing has not been done.  There is no family history of ovarian cancer. Mother died from pancreatic cancer  The patient denies smoking.  She denies drinking alcohol   She denies illegal drug use.  The patient exercises by walking 3x/week x 30 min  The patient denies current symptoms of depression.    Last cholesterol screening was in 07/2018 and was normal (LDL 129, TC193). Her hemoglobin A1C was 7.6% in 07/2018  Review of Systems: Review of Systems  Constitutional: Negative for chills, fever and weight loss (10# weight loss).  HENT: Positive for sore throat (and  hoarseness-was seeing ENT). Negative for congestion and sinus pain.   Eyes: Negative for blurred vision and pain.  Respiratory: Negative for hemoptysis, shortness of breath and wheezing.   Cardiovascular: Negative for chest pain, palpitations and leg swelling.       Occasional chest wall tenderness at right sternal border  Gastrointestinal: Positive for heartburn. Negative for abdominal pain, blood in stool, constipation, diarrhea, nausea and vomiting.  Genitourinary: Negative for dysuria, frequency, hematuria and urgency.  Musculoskeletal: Negative for back pain, joint pain and myalgias.  Skin: Negative for itching and rash.  Neurological: Negative for dizziness, tingling and headaches.  Endo/Heme/Allergies: Negative for environmental allergies and polydipsia. Does not bruise/bleed easily.       Negative for hirsutism   Psychiatric/Behavioral: Negative for depression. The patient is not nervous/anxious and does not have insomnia.     Past Medical History:  Past Medical History:  Diagnosis Date  . Anemia   . Chronic kidney disease   . Diabetes mellitus without complication ([place])   . Diverticulosis   . Duodenitis   . Heart murmur   . Hypertension   . Migraine     Past Surgical History:  Past Surgical History:  Procedure Laterality Date  . CHOLECYSTECTOMY     laparscopic-Dr Byrnett  . COLONOSCOPY WITH PROPOFOL N/A 04/15/2015   Procedure: COLONOSCOPY WITH PROPOFOL;  Surgeon: Lollie Sails, MD;  Location: [facility] ENDOSCOPY;  Service: Endoscopy;  Laterality: N/A;  . ESOPHAGOGASTRODUODENOSCOPY N/A 04/15/2015   Procedure: ESOPHAGOGASTRODUODENOSCOPY (EGD);  Surgeon: Lollie Sails, MD;  Location: [facility] ENDOSCOPY;  Service: Endoscopy;  Laterality: N/A;  .  KNEE ARTHROSCOPY W/ SYNOVECTOMY Left   . LAPAROSCOPIC SUPRACERVICAL HYSTERECTOMY  2009   fibroids/AUB  . TUBAL LIGATION Bilateral     Family History:  Family History  Problem Relation Age of Onset  . Pancreatic cancer Mother  [id number]  . Throat cancer Father   . Hypertension Father   . Hypertension Sister   . Diabetes Sister   . Breast cancer Sister [id number]  . Hypertension Brother   . Diabetes Maternal Aunt   . Hypertension Maternal Aunt   . Hypertension Maternal Uncle   . Diabetes Maternal Uncle     Social History:  Social History   Socioeconomic History  . Marital status: Married    Spouse name: Reuel BoomDaniel  . Number of children: 1  . Years of education: masters  . Highest education level: Not on file  Occupational History  . Occupation: Facilities managerteacher/ coach    Employer: [employer]  Social Needs  . Financial resource strain: Not on file  . Food insecurity    Worry: Not on file    Inability: Not on file  . Transportation needs    Medical: Not on file    Non-medical: Not on file  Tobacco Use  . Smoking status: Never Smoker  . Smokeless tobacco: Never Used  Substance and Sexual Activity  . Alcohol use: No  . Drug use: No  . Sexual activity: Yes    Partners: Male    Birth control/protection: Surgical, Post-menopausal    Comment: Hysterectomy   Lifestyle  . Physical activity    Days per week: Not on file    Minutes per session: Not on file  . Stress: Not on file  Relationships  . Social Musicianconnections    Talks on phone: Not on file    Gets together: Not on file    Attends religious service: Not on file    Active member of club or organization: Not on file    Attends meetings of clubs or organizations: Not on file    Relationship status: Not on file  . Intimate partner violence    Fear of current or ex partner: Not on file    Emotionally abused: Not on file    Physically abused: Not on file    Forced sexual activity: Not on file  Other Topics Concern  . Not on file  Social History Narrative   Has one granddaughter named " [name]" 54 years old (born 2016)    Allergies:  Allergies  Allergen Reactions  . Penicillins Hives and Swelling  . Sulfur Hives    Medications:  Current Outpatient  Medications:  Current Outpatient Medications on File Prior to Visit  Medication Sig Dispense Refill  . Cholecalciferol (VITAMIN D) 2000 UNITS CAPS Take 2,000 Units by mouth daily.    . ferrous fumarate (HEMOCYTE - 106 MG FE) 325 (106 FE) MG TABS tablet Take 1 tablet by mouth daily.    Marland Kitchen. losartan (COZAAR) 25 MG tablet TAKE ONE TABLET BY MOUTH ONCE DAILY 90 tablet 0  . metFORMIN (GLUCOPHAGE) 1000 MG tablet TAKE ONE TABLET BY MOUTH TWICE DAILY 180 tablet 0  . Multiple Vitamins-Minerals (WOMENS MULTIVITAMIN PLUS PO) Take 1 tablet by mouth daily.    Marland Kitchen. FARXIGA 5 MG TABS tablet Take 5 mg by mouth daily.  11   No current facility-administered medications on file prior to visit.          Physical Exam Vitals:BP 136/82   Pulse 91   Ht 5\' 6"  (1.676 m)  Wt 179 lb (81.2 kg)   BMI 28.89 kg/m  General: pleasant BF in  NAD HEENT: normocephalic, anicteric Neck: no thyroid enlargement, no palpable nodules, no cervical lymphadenopathy  Pulmonary: No increased work of breathing, CTAB Cardiovascular: RRR, with Grade I-II/VI systolic murmur heard at aortic area Breast: Breast symmetrical, no tenderness, no palpable nodules or masses, no skin or nipple retraction present, no nipple discharge.  No axillary, infraclavicular or supraclavicular lymphadenopathy. Abdomen: Soft, non-distended, non tender  Umbilicus without lesions.  No hepatomegaly or masses palpable. No evidence of hernia. Genitourinary:  External: Normal external female genitalia.  Normal urethral meatus, normal Bartholin's and Skene's glands.    Vagina: Normal vaginal mucosa, no evidence of prolapse.    Cervix: Grossly normal in appearance, no bleeding, non-tender  Uterus: surgically absent  Adnexa: No adnexal masses, non-tender  Rectal: deferred  Lymphatic: no evidence of inguinal lymphadenopathy Extremities: no edema, erythema, or tenderness Neurologic: Grossly intact Psychiatric: mood appropriate, affect full  Assessment: 53  y.o. G1P1001 annual gyn exam Family history of breast and pancreatic cancer  Plan:   1) Breast cancer screening - recommend monthly self breast exam and annual mammograms Mammogram is scheduled for next month. Will check with Helmut Musterlicia to see if she qualifies for genetic testing  2) Cervical cancer screening - Pap was done. ASCCP guidelines and rational discussed.  Patient opts for yearly screening interval  3) Colon cancer screening-Colonoscopy done 2016. She desires referral back to GI next year.  4) Routine healthcare maintenance including cholesterol and diabetes screening managed by PCP . Recommend calling [facility] to schedule follow up.  5) Discussed calcium requirements. Does take a vitamin D3 supplement.  6) RTO for annual in 1 year.  Farrel Connersolleen Real Cona, CNM

## 2019-05-10 ENCOUNTER — Encounter: Payer: Self-pay | Admitting: Certified Nurse Midwife

## 2019-05-10 ENCOUNTER — Other Ambulatory Visit (HOSPITAL_COMMUNITY)
Admission: RE | Admit: 2019-05-10 | Discharge: 2019-05-10 | Disposition: A | Discharge status: Home / Self Care | Payer: PRIVATE HEALTH INSURANCE | Source: Ambulatory Visit | Attending: Certified Nurse Midwife | Admitting: Certified Nurse Midwife

## 2019-05-10 ENCOUNTER — Other Ambulatory Visit: Payer: Self-pay

## 2019-05-10 ENCOUNTER — Ambulatory Visit: Payer: Self-pay | Admitting: Certified Nurse Midwife

## 2019-05-10 VITALS — BP 136/82 | HR 91 | Ht 66.0 in | Wt 179.0 lb

## 2019-05-10 DIAGNOSIS — Z124 Encounter for screening for malignant neoplasm of cervix: Secondary | ICD-10-CM | POA: Diagnosis present

## 2019-05-10 DIAGNOSIS — Z800 Family history of malignant neoplasm of digestive organs: Secondary | ICD-10-CM

## 2019-05-10 DIAGNOSIS — Z803 Family history of malignant neoplasm of breast: Secondary | ICD-10-CM

## 2019-05-10 DIAGNOSIS — Z01419 Encounter for gynecological examination (general) (routine) without abnormal findings: Secondary | ICD-10-CM | POA: Diagnosis not present

## 2019-05-12 LAB — CYTOLOGY - PAP: Diagnosis: NEGATIVE

## 2019-05-24 ENCOUNTER — Ambulatory Visit
Admission: RE | Admit: 2019-05-24 | Discharge: 2019-05-24 | Disposition: A | Discharge status: Home / Self Care | Payer: Self-pay | Source: Ambulatory Visit | Attending: Family Medicine | Admitting: Family Medicine

## 2019-05-24 ENCOUNTER — Other Ambulatory Visit: Payer: Self-pay

## 2019-05-24 DIAGNOSIS — Z1231 Encounter for screening mammogram for malignant neoplasm of breast: Secondary | ICD-10-CM | POA: Insufficient documentation

## 2019-07-13 ENCOUNTER — Ambulatory Visit: Payer: Self-pay | Admitting: Family Medicine

## 2019-07-17 NOTE — Progress Notes (Signed)
Patient: Jacqueline Romero Female    DOB: 1966-06-28   53 y.o.   MRN: [id number] Visit Date: 07/18/2019  Today's Provider: [name], MD   Chief Complaint  Patient presents with  . Diabetes   Subjective:   HPI  Yearly diabetic follow up. Patient states that she needs a new glucose meter and medication refill for Farxiga, Metformin and Losartan.  She now has 1 grandson and 1 granddaughter  Hypertension, follow-up:  BP Readings from Last 3 Encounters:  07/18/19 118/84  05/10/19 136/82  07/14/18 110/70    She was last seen for hypertension 1 years ago.  BP at that visit was 110/70. Management since that visit includes no changes. She reports good compliance with treatment. She is not having side effects.  She is not exercising. She is adherent to low salt diet.   Outside blood pressures are . She is experiencing none.  Patient denies none.    Weight trend: stable Wt Readings from Last 3 Encounters:  07/18/19 183 lb (83 kg)  05/10/19 179 lb (81.2 kg)  07/14/18 174 lb 6.4 oz (79.1 kg)    Current diet: well balanced   Diabetes Mellitus Type II, Follow-up:   Lab Results  Component Value Date   HGBA1C 7.6 (H) 08/01/2018   HGBA1C 7.7 (H) 03/07/2018   HGBA1C 7.6 (H) 10/14/2017    Last seen for diabetes 1 years ago.  Management since then includes no changes. She reports good compliance with treatment. She is not having side effects.  Current symptoms include none and have been stable. Home blood sugar records: postprandial range: 160-180 and trend: stable  Episodes of hypoglycemia? no   Current insulin regiment: Is not on insulin Most Recent Eye Exam:  Weight trend: stable Prior visit with dietician: No Current exercise: none Current diet habits: not asked  Pertinent Labs:    Component Value Date/Time   CHOL 193 08/01/2018 1017   CHOL 158 10/31/2012 1255   TRIG 53 08/01/2018 1017   TRIG 48 10/31/2012 1255   HDL 53 08/01/2018 1017    HDL 63 (H) 10/31/2012 1255   LDLCALC 129 (H) 08/01/2018 1017   LDLCALC 98 10/14/2017 1214   LDLCALC 85 10/31/2012 1255   CREATININE 0.90 08/01/2018 1017   CREATININE 0.73 10/14/2017 1214     Allergies  Allergen Reactions  . Penicillins Hives and Swelling  . Sulfur Hives     Current Outpatient Medications:  .  Cholecalciferol (VITAMIN D) 2000 UNITS CAPS, Take 2,000 Units by mouth daily., Disp: , Rfl:  .  FARXIGA 5 MG TABS tablet, Take 5 mg by mouth daily., Disp: , Rfl: 11 .  ferrous fumarate (HEMOCYTE - 106 MG FE) 325 (106 FE) MG TABS tablet, Take 1 tablet by mouth daily., Disp: , Rfl:  .  losartan (COZAAR) 25 MG tablet, TAKE ONE TABLET BY MOUTH ONCE DAILY, Disp: 90 tablet, Rfl: 0 .  metFORMIN (GLUCOPHAGE) 1000 MG tablet, TAKE ONE TABLET BY MOUTH TWICE DAILY, Disp: 180 tablet, Rfl: 0 .  Multiple Vitamins-Minerals (WOMENS MULTIVITAMIN PLUS PO), Take 1 tablet by mouth daily., Disp: , Rfl:   Review of Systems  Constitutional: Negative for activity change and fatigue.  Respiratory: Negative for cough and shortness of breath.   Cardiovascular: Negative for chest pain, palpitations and leg swelling.  Endocrine: Negative for cold intolerance, heat intolerance, polydipsia, polyphagia and polyuria.  Musculoskeletal: Negative for arthralgias, joint swelling and myalgias.       Mild  chronic bilateral hand pain.  Neurological: Negative for dizziness, light-headedness and headaches.  Psychiatric/Behavioral: Negative for agitation, self-injury, sleep disturbance and suicidal ideas. The patient is not nervous/anxious.     Social History   Tobacco Use  . Smoking status: Never Smoker  . Smokeless tobacco: Never Used  Substance Use Topics  . Alcohol use: No      Objective:   BP 118/84 (BP Location: Left Arm, Patient Position: Sitting, Cuff Size: Normal)   Pulse 77   Temp (!) 96.9 F (36.1 C) (Temporal)   Resp 18   Wt 183 lb (83 kg)   SpO2 97%   BMI 29.54 kg/m  Vitals:    07/18/19 1331  BP: 118/84  Pulse: 77  Resp: 18  Temp: (!) 96.9 F (36.1 C)  TempSrc: Temporal  SpO2: 97%  Weight: 183 lb (83 kg)  Body mass index is 29.54 kg/m.   Physical Exam Vitals signs reviewed.  Constitutional:      Appearance: She is well-developed.  HENT:     Head: Normocephalic.     Right Ear: External ear normal.     Left Ear: External ear normal.     Nose: Nose normal.  Eyes:     General: No scleral icterus.    Conjunctiva/sclera: Conjunctivae normal.  Neck:     Thyroid: No thyromegaly.  Cardiovascular:     Rate and Rhythm: Normal rate and regular rhythm.     Heart sounds: Normal heart sounds.  Pulmonary:     Effort: Pulmonary effort is normal.     Breath sounds: Normal breath sounds.  Abdominal:     Palpations: Abdomen is soft.  Lymphadenopathy:     Cervical: No cervical adenopathy.  Skin:    General: Skin is warm and dry.  Neurological:     Mental Status: She is alert and oriented to person, place, and time.     Comments: Normal diabetic monofilament exam of feet.  Psychiatric:        Behavior: Behavior normal.        Thought Content: Thought content normal.        Judgment: Judgment normal.      No results found for any visits on 07/18/19.     Assessment & Plan    1. Essential hypertension Controlled on Cozaar. - CBC with Differential/Platelet - Comprehensive metabolic panel  2. Type 2 diabetes mellitus without complication, without long-term current use of insulin (HCC) On metformin. - Hemoglobin A1c--7.6 today--goal less than 7.  3. Pure hypercholesterolemia With DM start Atorvaststin 40mg  daily. - Lipid panel - TSH - atorvastatin (LIPITOR) 40 MG tablet; Take 1 tablet (40 mg total) by mouth daily.  Dispense: 90 tablet; Refill: 3  4. Primary osteoarthritis of both hands Try tumeric daily.     Richard Cranford Mon, MD  [facility]

## 2019-07-18 ENCOUNTER — Ambulatory Visit (INDEPENDENT_AMBULATORY_CARE_PROVIDER_SITE_OTHER): Payer: Self-pay | Admitting: Family Medicine

## 2019-07-18 ENCOUNTER — Encounter: Payer: Self-pay | Admitting: Family Medicine

## 2019-07-18 ENCOUNTER — Other Ambulatory Visit: Payer: Self-pay

## 2019-07-18 VITALS — BP 118/84 | HR 77 | Temp 96.9°F | Resp 18 | Wt 183.0 lb

## 2019-07-18 DIAGNOSIS — E7800 Pure hypercholesterolemia, unspecified: Secondary | ICD-10-CM

## 2019-07-18 DIAGNOSIS — E119 Type 2 diabetes mellitus without complications: Secondary | ICD-10-CM

## 2019-07-18 DIAGNOSIS — I10 Essential (primary) hypertension: Secondary | ICD-10-CM

## 2019-07-18 DIAGNOSIS — M19041 Primary osteoarthritis, right hand: Secondary | ICD-10-CM

## 2019-07-18 DIAGNOSIS — M19042 Primary osteoarthritis, left hand: Secondary | ICD-10-CM

## 2019-07-18 MED ORDER — ATORVASTATIN CALCIUM 40 MG PO TABS: 40.0000 mg | ORAL_TABLET | Freq: Every day | ORAL | 3 refills | Status: DC

## 2019-07-18 NOTE — Patient Instructions (Signed)
1. Have labs drawn in three weeks. 2. Try OTC Tumeric daily for pain.

## 2019-08-22 ENCOUNTER — Other Ambulatory Visit: Payer: Self-pay | Admitting: Physician Assistant

## 2019-08-23 MED ORDER — LOSARTAN POTASSIUM 25 MG PO TABS: 25.0000 mg | ORAL_TABLET | Freq: Every day | ORAL | 3 refills | Status: DC

## 2019-08-23 MED ORDER — METFORMIN HCL 1000 MG PO TABS: 1000.0000 mg | ORAL_TABLET | Freq: Two times a day (BID) | ORAL | 3 refills | Status: DC

## 2019-09-05 LAB — COMPREHENSIVE METABOLIC PANEL
ALT: 14 IU/L (ref 0–32)
AST: 16 IU/L (ref 0–40)
Albumin/Globulin Ratio: 2 (ref 1.2–2.2)
Albumin: 4.7 g/dL (ref 3.8–4.9)
Alkaline Phosphatase: 80 IU/L (ref 39–117)
BUN/Creatinine Ratio: 13 (ref 9–23)
BUN: 11 mg/dL (ref 6–24)
Bilirubin Total: 0.4 mg/dL (ref 0.0–1.2)
CO2: 21 mmol/L (ref 20–29)
Calcium: 9.3 mg/dL (ref 8.7–10.2)
Chloride: 106 mmol/L (ref 96–106)
Creatinine, Ser: 0.87 mg/dL (ref 0.57–1.00)
GFR calc Af Amer: 88 mL/min/{1.73_m2} (ref >59)
GFR calc non Af Amer: 76 mL/min/{1.73_m2} (ref >59)
Globulin, Total: 2.3 g/dL (ref 1.5–4.5)
Glucose: 116 mg/dL — ABNORMAL HIGH (ref 65–99)
Potassium: 4.1 mmol/L (ref 3.5–5.2)
Sodium: 145 mmol/L — ABNORMAL HIGH (ref 134–144)
Total Protein: 7 g/dL (ref 6.0–8.5)

## 2019-09-05 LAB — CBC WITH DIFFERENTIAL/PLATELET
Basophils Absolute: 0 10*3/uL (ref 0.0–0.2)
Basos: 1 %
EOS (ABSOLUTE): 0.1 10*3/uL (ref 0.0–0.4)
Eos: 1 %
Hematocrit: 41.8 % (ref 34.0–46.6)
Hemoglobin: 13.9 g/dL (ref 11.1–15.9)
Immature Grans (Abs): 0 10*3/uL (ref 0.0–0.1)
Immature Granulocytes: 0 %
Lymphocytes Absolute: 2.5 10*3/uL (ref 0.7–3.1)
Lymphs: 51 %
MCH: 29.3 pg (ref 26.6–33.0)
MCHC: 33.3 g/dL (ref 31.5–35.7)
MCV: 88 fL (ref 79–97)
Monocytes Absolute: 0.4 10*3/uL (ref 0.1–0.9)
Monocytes: 8 %
Neutrophils Absolute: 1.9 10*3/uL (ref 1.4–7.0)
Neutrophils: 39 %
Platelets: 223 10*3/uL (ref 150–450)
RBC: 4.74 x10E6/uL (ref 3.77–5.28)
RDW: 12 % (ref 11.7–15.4)
WBC: 4.9 10*3/uL (ref 3.4–10.8)

## 2019-09-05 LAB — LIPID PANEL
Chol/HDL Ratio: 2.9 ratio (ref 0.0–4.4)
Cholesterol, Total: 159 mg/dL (ref 100–199)
HDL: 55 mg/dL (ref >39)
LDL Chol Calc (NIH): 92 mg/dL (ref 0–99)
Triglycerides: 59 mg/dL (ref 0–149)
VLDL Cholesterol Cal: 12 mg/dL (ref 5–40)

## 2019-09-05 LAB — TSH: TSH: 1.11 u[IU]/mL (ref 0.450–4.500)

## 2019-09-05 LAB — HEMOGLOBIN A1C
Est. average glucose Bld gHb Est-mCnc: 180 mg/dL
Hgb A1c MFr Bld: 7.9 % — ABNORMAL HIGH (ref 4.8–5.6)

## 2019-09-06 ENCOUNTER — Telehealth: Payer: Self-pay | Admitting: *Deleted

## 2019-09-06 NOTE — Telephone Encounter (Signed)
Patient was notified of results. Patient has not been taking [place] due to cost of medication. Please advise?

## 2019-09-06 NOTE — Telephone Encounter (Signed)
-----   Message from [name]., MD sent at 09/06/2019 10:34 AM EST ----- Labs stable.  Is patient still taking [place]?  If so will increase to 10 mg daily

## 2019-09-07 NOTE — Telephone Encounter (Signed)
Patient was advised.  

## 2019-09-07 NOTE — Telephone Encounter (Signed)
Stay on the Metformin.  Continue to work on diet and exercise.  A1c is under fairly good control

## 2019-12-18 DIAGNOSIS — Z1371 Encounter for nonprocreative screening for genetic disease carrier status: Secondary | ICD-10-CM

## 2019-12-18 HISTORY — DX: Encounter for nonprocreative screening for genetic disease carrier status: Z13.71

## 2019-12-21 DIAGNOSIS — Z800 Family history of malignant neoplasm of digestive organs: Secondary | ICD-10-CM | POA: Insufficient documentation

## 2019-12-21 DIAGNOSIS — Z803 Family history of malignant neoplasm of breast: Secondary | ICD-10-CM | POA: Insufficient documentation

## 2019-12-21 NOTE — Progress Notes (Signed)
Jacqueline Banana., MD   Chief Complaint  Patient presents with  . Breast exam    rash and itchiness on RB only x 2 weeks    HPI:      Jacqueline Romero is a 54 y.o. G1P1001 who LMP was No LMP recorded. Patient has had a hysterectomy., presents today for RT breast rash/itch for 2 wks. Notices erythema with a few bumps on skin. No bites, no nipple d/c, no breast masses. Has tried calamine lotion without relief. Uses all sensitive skin products, including dryer sheets on her bras.  Last mammo neg 8/20 Sister with breast cancer last yr (seen at [facility]), mom with pancreatic cancer. Pt unsure if Genetic testing done with sister.    Past Medical History:  Diagnosis Date  . Anemia   . Chronic kidney disease   . Diabetes mellitus without complication ([place])   . Diverticulosis   . Duodenitis   . Heart murmur   . Hypertension   . Migraine     Past Surgical History:  Procedure Laterality Date  . CHOLECYSTECTOMY     laparscopic-Dr Byrnett  . COLONOSCOPY WITH PROPOFOL N/A 04/15/2015   Procedure: COLONOSCOPY WITH PROPOFOL;  Surgeon: Lollie Sails, MD;  Location: [facility] ENDOSCOPY;  Service: Endoscopy;  Laterality: N/A;  . ESOPHAGOGASTRODUODENOSCOPY N/A 04/15/2015   Procedure: ESOPHAGOGASTRODUODENOSCOPY (EGD);  Surgeon: Lollie Sails, MD;  Location[facility] ENDOSCOPY;  Service: Endoscopy;  Laterality: N/A;  . KNEE ARTHROSCOPY W/ SYNOVECTOMY Left   . LAPAROSCOPIC SUPRACERVICAL HYSTERECTOMY  2009   fibroids/AUB  . TUBAL LIGATION Bilateral     Family History  Problem Relation Age of Onset  . Pancreatic cancer Mother 58  . Throat cancer Father   . Hypertension Father   . Hypertension Sister   . Diabetes Sister   . Breast cancer Sister 79  . Hypertension Brother   . Diabetes Maternal Aunt   . Hypertension Maternal Aunt   . Hypertension Maternal Uncle   . Diabetes Maternal Uncle     Social History   Socioeconomic History  . Marital status: Married    Spouse name:  Quillian Quince  . Number of children: 1  . Years of education: masters  . Highest education level: Not on file  Occupational History  . Occupation: Primary school teacher: [employer]  Tobacco Use  . Smoking status: Never Smoker  . Smokeless tobacco: Never Used  Substance and Sexual Activity  . Alcohol use: No  . Drug use: No  . Sexual activity: Yes    Partners: Male    Birth control/protection: Surgical, Post-menopausal    Comment: Hysterectomy   Other Topics Concern  . Not on file  Social History Narrative   Has one granddaughter named " Jacqueline Romero" 55 years old (born 2016)   Social Determinants of Health   Financial Resource Strain:   . Difficulty of Paying Living Expenses: Not on file  Food Insecurity:   . Worried About Charity fundraiser in the Last Year: Not on file  . Ran Out of Food in the Last Year: Not on file  Transportation Needs:   . Lack of Transportation (Medical): Not on file  . Lack of Transportation (Non-Medical): Not on file  Physical Activity:   . Days of Exercise per Week: Not on file  . Minutes of Exercise per Session: Not on file  Stress:   . Feeling of Stress : Not on file  Social Connections:   .  Frequency of Communication with Friends and Family: Not on file  . Frequency of Social Gatherings with Friends and Family: Not on file  . Attends Religious Services: Not on file  . Active Member of Clubs or Organizations: Not on file  . Attends Archivist Meetings: Not on file  . Marital Status: Not on file  Intimate Partner Violence:   . Fear of Current or Ex-Partner: Not on file  . Emotionally Abused: Not on file  . Physically Abused: Not on file  . Sexually Abused: Not on file    Outpatient Medications Prior to Visit  Medication Sig Dispense Refill  . atorvastatin (LIPITOR) 40 MG tablet Take 1 tablet (40 mg total) by mouth daily. 90 tablet 3  . Cholecalciferol (VITAMIN D) 2000 UNITS CAPS Take 2,000 Units by mouth daily.    . ferrous  fumarate (HEMOCYTE - 106 MG FE) 325 (106 FE) MG TABS tablet Take 1 tablet by mouth daily.    Marland Kitchen losartan (COZAAR) 25 MG tablet Take 1 tablet (25 mg total) by mouth daily. 90 tablet 3  . metFORMIN (GLUCOPHAGE) 1000 MG tablet Take 1 tablet (1,000 mg total) by mouth 2 (two) times daily. 180 tablet 3  . Multiple Vitamins-Minerals (WOMENS MULTIVITAMIN PLUS PO) Take 1 tablet by mouth daily.    Marland Kitchen FARXIGA 5 MG TABS tablet Take 5 mg by mouth daily.  11   No facility-administered medications prior to visit.      ROS:  Review of Systems  Constitutional: Negative for fatigue, fever and unexpected weight change.  Respiratory: Negative for cough, shortness of breath and wheezing.   Cardiovascular: Negative for chest pain, palpitations and leg swelling.  Gastrointestinal: Negative for blood in stool, constipation, diarrhea, nausea and vomiting.  Endocrine: Negative for cold intolerance, heat intolerance and polyuria.  Genitourinary: Negative for dyspareunia, dysuria, flank pain, frequency, genital sores, hematuria, menstrual problem, pelvic pain, urgency, vaginal bleeding, vaginal discharge and vaginal pain.  Musculoskeletal: Positive for arthralgias. Negative for back pain, joint swelling and myalgias.  Skin: Positive for rash.  Neurological: Negative for dizziness, syncope, light-headedness, numbness and headaches.  Hematological: Negative for adenopathy.  Psychiatric/Behavioral: Negative for agitation, confusion, sleep disturbance and suicidal ideas. The patient is not nervous/anxious.    BREAST: rash, no masses   OBJECTIVE:   Vitals:  BP 100/70   Ht _0  (1.676 m)   Wt 183 lb (83 kg)   BMI 29.54 kg/m   Physical Exam Vitals reviewed.  Pulmonary:     Effort: Pulmonary effort is normal.  Chest:     Breasts: Breasts are symmetrical.        Right: No inverted nipple, mass, nipple discharge, skin change or tenderness.        Left: No inverted nipple, mass, nipple discharge, skin change or  tenderness.    Musculoskeletal:        General: Normal range of motion.     Cervical back: Normal range of motion.  Skin:    General: Skin is warm and dry.  Neurological:     General: No focal deficit present.     Mental Status: She is alert and oriented to person, place, and time.     Cranial Nerves: No cranial nerve deficit.  Psychiatric:        Mood and Affect: Mood normal.        Behavior: Behavior normal.        Thought Content: Thought content normal.        Judgment:  Judgment normal.     Assessment/Plan: Rash - Plan: clotrimazole-betamethasone (LOTRISONE) cream; RT breast for 2 wks. Rx lotrisone crm BID for 1-2 wks. F/u if sx persist after 2 wks. Question chem. Line dry bras (no dryer sheets).  Family history of breast cancer - Plan: Integrated BRACAnalysis ([place]); MyRisk testing discussed and done today. Will call with results.   Family history of pancreatic cancer - Plan: Integrated BRACAnalysis ([place])    Meds ordered this encounter  Medications  . clotrimazole-betamethasone (LOTRISONE) cream    Sig: Apply externally BID prn sx up to 2 wks    Dispense:  45 g    Refill:  0    Order Specific Question:   Supervising Provider    Answer:   Gae Dry [[id number]]      Return if symptoms worsen or fail to improve.  Marykate Heuberger B. Arpi Diebold, PA-C 12/22/2019 8:30 AM

## 2019-12-22 ENCOUNTER — Other Ambulatory Visit: Payer: Self-pay

## 2019-12-22 ENCOUNTER — Ambulatory Visit (INDEPENDENT_AMBULATORY_CARE_PROVIDER_SITE_OTHER): Payer: PRIVATE HEALTH INSURANCE | Admitting: Obstetrics and Gynecology

## 2019-12-22 ENCOUNTER — Encounter: Payer: Self-pay | Admitting: Obstetrics and Gynecology

## 2019-12-22 VITALS — BP 100/70 | Ht 66.0 in | Wt 183.0 lb

## 2019-12-22 DIAGNOSIS — Z803 Family history of malignant neoplasm of breast: Secondary | ICD-10-CM

## 2019-12-22 DIAGNOSIS — N6459 Other signs and symptoms in breast: Secondary | ICD-10-CM

## 2019-12-22 DIAGNOSIS — R21 Rash and other nonspecific skin eruption: Secondary | ICD-10-CM

## 2019-12-22 DIAGNOSIS — Z800 Family history of malignant neoplasm of digestive organs: Secondary | ICD-10-CM

## 2019-12-22 MED ORDER — CLOTRIMAZOLE-BETAMETHASONE 1-0.05 % EX CREA: TOPICAL_CREAM | CUTANEOUS | 0 refills | Status: DC

## 2019-12-22 NOTE — Patient Instructions (Signed)
I value your feedback and entrusting us with your care. If you get a [facility] patient survey, I would appreciate you taking the time to let us know about your experience today. Thank you!  As of September 28, 2019, your lab results will be released to your MyChart immediately, before I even have a chance to see them. Please give me time to review them and contact you if there are any abnormalities. Thank you for your patience.  

## 2020-01-05 ENCOUNTER — Encounter: Payer: Self-pay | Admitting: Obstetrics and Gynecology

## 2020-01-09 ENCOUNTER — Encounter: Payer: Self-pay | Admitting: Obstetrics and Gynecology

## 2020-01-10 NOTE — Progress Notes (Signed)
Patient: Akire Rennert Muela Female    DOB: [birth year]   54 y.o.   MRN: [id number] Visit Date: 01/15/2020  Today's Provider: [name], MD   Chief Complaint  Patient presents with  . Hypertension  . Diabetes Mellitus  . Hyperlipidemia   Subjective:     HPI  Patient doing well and feels well.  He does not wish to change any of her medications. Essential hypertension From 07/18/2019-Controlled on Cozaar. Labs checked showing-stable.  Type 2 diabetes mellitus without complication, without long-term current use of insulin (HCC) From 07/18/2019-On metformin. Hemoglobin A1c--7.6--goal less than 7.  Pure hypercholesterolemia From 07/18/2019-With DM start Atorvaststin 40mg  daily. Labs checked showing-stable.  Primary osteoarthritis of both hands From 07/18/2019-Try tumeric daily.  Allergies  Allergen Reactions  . Penicillins Hives and Swelling  . Sulfur Hives     Current Outpatient Medications:  .  atorvastatin (LIPITOR) 40 MG tablet, Take 1 tablet (40 mg total) by mouth daily., Disp: 90 tablet, Rfl: 3 .  Cholecalciferol (VITAMIN D) 2000 UNITS CAPS, Take 2,000 Units by mouth daily., Disp: , Rfl:  .  clotrimazole-betamethasone (LOTRISONE) cream, Apply externally BID prn sx up to 2 wks, Disp: 45 g, Rfl: 0 .  ferrous fumarate (HEMOCYTE - 106 MG FE) 325 (106 FE) MG TABS tablet, Take 1 tablet by mouth daily., Disp: , Rfl:  .  losartan (COZAAR) 25 MG tablet, Take 1 tablet (25 mg total) by mouth daily., Disp: 90 tablet, Rfl: 3 .  metFORMIN (GLUCOPHAGE) 1000 MG tablet, Take 1 tablet (1,000 mg total) by mouth 2 (two) times daily., Disp: 180 tablet, Rfl: 3 .  Multiple Vitamins-Minerals (WOMENS MULTIVITAMIN PLUS PO), Take 1 tablet by mouth daily., Disp: , Rfl:   Review of Systems  Constitutional: Negative for appetite change, chills, fatigue and fever.  HENT: Negative.   Eyes: Negative.   Respiratory: Negative for chest tightness and shortness of breath.    Cardiovascular: Negative for chest pain and palpitations.  Gastrointestinal: Negative for abdominal pain, nausea and vomiting.  Endocrine: Negative.   Musculoskeletal: Negative.   Allergic/Immunologic: Negative.   Neurological: Negative for dizziness and weakness.  Psychiatric/Behavioral: Negative.     Social History   Tobacco Use  . Smoking status: Never Smoker  . Smokeless tobacco: Never Used  Substance Use Topics  . Alcohol use: No      Objective:   There were no vitals taken for this visit. There were no vitals filed for this visit.There is no height or weight on file to calculate BMI.   Physical Exam Vitals reviewed.  Constitutional:      Appearance: She is well-developed.  HENT:     Head: Normocephalic.     Right Ear: External ear normal.     Left Ear: External ear normal.     Nose: Nose normal.  Eyes:     General: No scleral icterus.    Conjunctiva/sclera: Conjunctivae normal.  Neck:     Thyroid: No thyromegaly.  Cardiovascular:     Rate and Rhythm: Normal rate and regular rhythm.     Heart sounds: Normal heart sounds.  Pulmonary:     Effort: Pulmonary effort is normal.     Breath sounds: Normal breath sounds.  Abdominal:     Palpations: Abdomen is soft.  Lymphadenopathy:     Cervical: No cervical adenopathy.  Skin:    General: Skin is warm and dry.  Neurological:     Mental Status: She is alert and oriented  to person, place, and time.     Comments: Normal diabetic monofilament exam of feet.  Psychiatric:        Behavior: Behavior normal.        Thought Content: Thought content normal.        Judgment: Judgment normal.      No results found for any visits on 01/15/20.     Assessment & Plan    1. Type 2 diabetes mellitus without complication, without long-term current use of insulin (HCC) A1c 7.7.  Patient wishes to work on diet and exercise.  She is on Metformin 1000 mg twice daily.  Consider adding Actos.  2. Essential hypertension On  losartan  3. Pure hypercholesterolemia On atorvastatin     Wilhemena Durie, MD  [facility]

## 2020-01-11 NOTE — Telephone Encounter (Signed)
Pt aware of neg MyRisk results except 3 MSH3 VUS and CKDN2A VUS. IBIS=16.14%. No further screening recommended. Current on CBE and mammos.  Patient understands these results only apply to her and her children, and this is not indicative of genetic testing results of her other family members. It is recommended that her other family members have genetic testing done.  Pt also understands negative genetic testing doesn't mean she will never get any of these cancers.   Hard copy mailed to pt. F/u prn.

## 2020-01-15 ENCOUNTER — Ambulatory Visit (INDEPENDENT_AMBULATORY_CARE_PROVIDER_SITE_OTHER): Payer: PRIVATE HEALTH INSURANCE | Admitting: Family Medicine

## 2020-01-15 ENCOUNTER — Other Ambulatory Visit: Payer: Self-pay

## 2020-01-15 ENCOUNTER — Encounter: Payer: Self-pay | Admitting: Family Medicine

## 2020-01-15 VITALS — BP 145/79 | HR 71 | Temp 96.6°F | Ht 66.0 in | Wt 184.4 lb

## 2020-01-15 DIAGNOSIS — E7800 Pure hypercholesterolemia, unspecified: Secondary | ICD-10-CM | POA: Diagnosis not present

## 2020-01-15 DIAGNOSIS — E119 Type 2 diabetes mellitus without complications: Secondary | ICD-10-CM

## 2020-01-15 DIAGNOSIS — I10 Essential (primary) hypertension: Secondary | ICD-10-CM | POA: Diagnosis not present

## 2020-02-29 ENCOUNTER — Other Ambulatory Visit: Payer: Self-pay | Admitting: Family Medicine

## 2020-02-29 DIAGNOSIS — Z1231 Encounter for screening mammogram for malignant neoplasm of breast: Secondary | ICD-10-CM

## 2020-05-13 NOTE — Progress Notes (Signed)
Established patient visit  I,[name],acting as a scribe for [name], MD.,have documented all relevant documentation on the behalf of [name], MD,as directed by  [name], MD while in the presence of [name], MD.   Patient: Jacqueline Romero   DOB: [birth year]   54 y.o. Female  MRN: [id number] Visit Date: 05/16/2020  Today's healthcare provider: [name], MD   Chief Complaint  Patient presents with  . Diabetes  . Follow-up  . Hyperlipidemia  . Hypertension   Subjective    HPI  Patient doing well.  She would like a continuous glucose monitor such as libre. Diabetes Mellitus Type II, follow-up  Lab Results  Component Value Date   HGBA1C 7.5 (A) 05/16/2020   HGBA1C 7.9 (H) 09/04/2019   HGBA1C 7.6 (H) 08/01/2018   Last seen for diabetes 4 months ago.  Management since then includes; A1c 7.7.  Patient wishes to work on diet and exercise.  She is on Metformin 1000 mg twice daily.  Consider adding Actos. She reports good compliance with treatment. She is not having side effects. none  Home blood sugar records: fasting range: not checking  Episodes of hypoglycemia? No none   Current insulin regiment: n/a  Most Recent Eye Exam: due  --------------------------------------------------------------------  Hypertension, follow-up  BP Readings from Last 3 Encounters:  05/16/20 116/74  05/15/20 (!) 130/80  01/15/20 (!) 145/79   Wt Readings from Last 3 Encounters:  05/16/20 178 lb (80.7 kg)  05/15/20 178 lb (80.7 kg)  01/15/20 184 lb 6.4 oz (83.6 kg)     She was last seen for hypertension 4 months ago.  BP at that visit was 145/79. Management since that visit includes; On losartan. She reports good compliance with treatment. She is not having side effects. none She is exercising. She is not adherent to low salt diet.   Outside blood pressures are none.  She does not smoke.  Use of agents associated with  hypertension: none.   --------------------------------------------------------------------  Lipid/Cholesterol, follow-up  Last Lipid Panel: Lab Results  Component Value Date   CHOL 159 09/04/2019   LDLCALC 92 09/04/2019   HDL 55 09/04/2019   TRIG 59 09/04/2019    She was last seen for this 4 months ago.  Management since that visit includes; On atorvastatin. She reports good compliance with treatment. She is not having side effects. none She is following a Regular diet. Current exercise: walking  Last metabolic panel Lab Results  Component Value Date   GLUCOSE 116 (H) 09/04/2019   NA 145 (H) 09/04/2019   K 4.1 09/04/2019   BUN 11 09/04/2019   CREATININE 0.87 09/04/2019   GFRNONAA 76 09/04/2019   GFRAA 88 09/04/2019   CALCIUM 9.3 09/04/2019   AST 16 09/04/2019   ALT 14 09/04/2019   The 10-year ASCVD risk score [name], et al., 2013) is: 6.2%  --------------------------------------------------------------------       Medications: Outpatient Medications Prior to Visit  Medication Sig  . atorvastatin (LIPITOR) 40 MG tablet Take 1 tablet (40 mg total) by mouth daily.  . Cholecalciferol (VITAMIN D) 2000 UNITS CAPS Take 2,000 Units by mouth daily.  . clotrimazole-betamethasone (LOTRISONE) cream Apply externally BID prn sx up to 2 wks  . ferrous fumarate (HEMOCYTE - 106 MG FE) 325 (106 FE) MG TABS tablet Take 1 tablet by mouth daily.   . hydrocortisone 2.5 % cream Apply 1 application topically at bedtime.  Marland Kitchen losartan (  COZAAR) 25 MG tablet Take 1 tablet (25 mg total) by mouth daily.  . metFORMIN (GLUCOPHAGE) 1000 MG tablet Take 1 tablet (1,000 mg total) by mouth 2 (two) times daily.  . Multiple Vitamins-Minerals (WOMENS MULTIVITAMIN PLUS PO) Take 1 tablet by mouth daily.   No facility-administered medications prior to visit.    Review of Systems  Constitutional: Negative for appetite change, chills, fatigue and fever.  Respiratory: Negative for chest tightness  and shortness of breath.   Cardiovascular: Negative for chest pain and palpitations.  Gastrointestinal: Negative for abdominal pain, nausea and vomiting.  Neurological: Negative for dizziness and weakness.    Last hemoglobin A1c Lab Results  Component Value Date   HGBA1C 7.5 (A) 05/16/2020      Objective    BP 116/74 (BP Location: Right Arm, Patient Position: Sitting, Cuff Size: Large)   Pulse 78   Temp (!) 96.9 F (36.1 C) (Other (Comment))   Resp 16   Ht 5\' 6"  (1.676 m)   Wt 178 lb (80.7 kg)   SpO2 99%   BMI 28.73 kg/m  Wt Readings from Last 3 Encounters:  05/16/20 178 lb (80.7 kg)  05/15/20 178 lb (80.7 kg)  01/15/20 184 lb 6.4 oz (83.6 kg)      Physical Exam Vitals reviewed.  Constitutional:      Appearance: She is well-developed.  HENT:     Head: Normocephalic.     Right Ear: External ear normal.     Left Ear: External ear normal.     Nose: Nose normal.  Eyes:     General: No scleral icterus.    Conjunctiva/sclera: Conjunctivae normal.  Neck:     Thyroid: No thyromegaly.  Cardiovascular:     Rate and Rhythm: Normal rate and regular rhythm.     Heart sounds: Normal heart sounds.  Pulmonary:     Effort: Pulmonary effort is normal.     Breath sounds: Normal breath sounds.  Abdominal:     Palpations: Abdomen is soft.  Lymphadenopathy:     Cervical: No cervical adenopathy.  Skin:    General: Skin is warm and dry.  Neurological:     Mental Status: She is alert and oriented to person, place, and time.     Comments: Normal diabetic monofilament exam of feet.  Psychiatric:        Behavior: Behavior normal.        Thought Content: Thought content normal.        Judgment: Judgment normal.     BP 116/74 (BP Location: Right Arm, Patient Position: Sitting, Cuff Size: Large)   Pulse 78   Temp (!) 96.9 F (36.1 C) (Other (Comment))   Resp 16   Ht 5\' 6"  (1.676 m)   Wt 178 lb (80.7 kg)   SpO2 99%   BMI 28.73 kg/m   General Appearance:    Alert,  cooperative, no distress, appears stated age  Head:    Normocephalic, without obvious abnormality, atraumatic  Eyes:    PERRL, conjunctiva/corneas clear, EOM's intact, fundi    benign, both eyes  Ears:    Normal TM's and external ear canals, both ears  Nose:   Nares normal, septum midline, mucosa normal, no drainage    or sinus tenderness  Throat:   Lips, mucosa, and tongue normal; teeth and gums normal  Neck:   Supple, symmetrical, trachea midline, no adenopathy;    thyroid:  no enlargement/tenderness/nodules; no carotid   bruit or JVD  Back:  Symmetric, no curvature, ROM normal, no CVA tenderness  Lungs:     Clear to auscultation bilaterally, respirations unlabored  Chest Wall:    No tenderness or deformity   Heart:    Regular rate and rhythm, S1 and S2 normal, no murmur, rub   or gallop  Breast Exam:    No tenderness, masses, or nipple abnormality  Abdomen:     Soft, non-tender, bowel sounds active all four quadrants,    no masses, no organomegaly  Genitalia:    Normal female without lesion, discharge or tenderness  Rectal:    Normal tone, normal prostate, no masses or tenderness;   guaiac negative stool  Extremities:   Extremities normal, atraumatic, no cyanosis or edema  Pulses:   2+ and symmetric all extremities  Skin:   Skin color, texture, turgor normal, no rashes or lesions  Lymph nodes:   Cervical, supraclavicular, and axillary nodes normal  Neurologic:   CNII-XII intact, normal strength, sensation and reflexes    throughout     Results for orders placed or performed in visit on 05/16/20  POCT glycosylated hemoglobin (Hb A1C)  Result Value Ref Range   Hemoglobin A1C 7.5 (A) 4.0 - 5.6 %   Est. average glucose Bld gHb Est-mCnc 169     Assessment & Plan     1. Type 2 diabetes mellitus without complication, without long-term current use of insulin (HCC)  - POCT glycosylated hemoglobin (Hb A1C)-7.5 today. - Continuous Blood Gluc Receiver (FREESTYLE LIBRE 2 READER)  DEVI; 1 each by Does not apply route once for 1 dose.  Dispense: 1 each; Refill: 0 - Continuous Blood Gluc Sensor (FREESTYLE LIBRE 2 SENSOR) MISC; 1 each by Does not apply route once for 1 dose.  Dispense: 1 each; Refill: 0  2. Essential hypertension   3. Pure hypercholesterolemia    Return in about 4 months (around 09/16/2020).         Sera Hitsman Wendelyn Breslow, MD  [facility] [phone] (phone) [phone] (fax)  [facility]

## 2020-05-14 NOTE — Progress Notes (Addendum)
Gynecology Annual Exam  PCP: [name]., MD  Chief Complaint:  Chief Complaint  Patient presents with  . Gynecologic Exam    rash left breast    History of Present Illness: Jacqueline Romero is a 54 y.o. G1P1001 who presents for her annual exam. The patient denies any significant gyn concerns except a itchy rash on her left breast. She had a similar rash on her right breast 12/22/19 that resolved with Lotrisone..  Since her last annual 05/10/2019, she has had no other significant changes in her health.  She had a EGD done in 2016 which showed duodenitis. Colonoscopy in 2016 showed diverticulosis.   Her menses are absent, since her [facility] in 2009 for AUB and fibroids. She denies vaginal spotting and hot flashes.  Last pap smear: 05/10/2019, results were NIL The patient is sexually active. She does not have dyspareunia.   Her past medical history is remarkable for diabetes II, hypertension, hyperlipidemia, anemia, seasonal allergies, GERD and chronic constipation.   The patient does perform self breast exams. Her last mammogram was 05/24/2019, results were negative.   Her sister was diagnosed with early breast cancer two years ago at the age of 78 (had lumpectomy and radiation).   There is no family history of ovarian cancer. Mother died from pancreatic cancer. Genetic testing was done and was negative except for four variants of unknown significance. Lifetime risk of breast cancer is 16.1%.   The patient denies smoking.  She denies drinking alcohol   She denies illegal drug use.  The patient exercises by walking 3x/week x 15 min  The patient denies current symptoms of depression.    Last cholesterol screening was in 08/2019 and was normal. Her hemoglobin A1C was 7.9%  Review of Systems: Review of Systems  Constitutional: Negative for chills, fever and weight loss.  HENT: Negative for congestion, sinus pain and sore throat.   Eyes: Negative for blurred vision and  pain.  Respiratory: Negative for hemoptysis, shortness of breath and wheezing.   Cardiovascular: Negative for chest pain, palpitations and leg swelling.  Gastrointestinal: Negative for abdominal pain, blood in stool, constipation, diarrhea, heartburn, nausea and vomiting.  Genitourinary: Negative for dysuria, frequency, hematuria and urgency.  Musculoskeletal: Negative for back pain, joint pain and myalgias.  Skin: Positive for itching (left breast) and rash (left breast).  Neurological: Negative for dizziness, tingling and headaches.  Endo/Heme/Allergies: Negative for environmental allergies and polydipsia. Does not bruise/bleed easily.          Psychiatric/Behavioral: Negative for depression. The patient is not nervous/anxious and does not have insomnia.     Past Medical History:  Past Medical History:  Diagnosis Date  . Anemia   . BRCA negative 12/2019   MyRisk neg except CDKN2A and 3-MSH3 VUS; IBIS=16.1%  . Chronic kidney disease   . Diabetes mellitus without complication ([place])   . Diverticulosis   . Duodenitis   . Family history of pancreatic cancer   . Heart murmur   . Hypertension   . Migraine     Past Surgical History:  Past Surgical History:  Procedure Laterality Date  . CHOLECYSTECTOMY     laparscopic-Dr Byrnett  . COLONOSCOPY WITH PROPOFOL N/A 04/15/2015   Procedure: COLONOSCOPY WITH PROPOFOL;  Surgeon: Lollie Sails, MD;  Location: [facility] ENDOSCOPY;  Service: Endoscopy;  Laterality: N/A;  . ESOPHAGOGASTRODUODENOSCOPY N/A 04/15/2015   Procedure: ESOPHAGOGASTRODUODENOSCOPY (EGD);  Surgeon: Lollie Sails, MD;  Location[facility] ENDOSCOPY;  Service: Endoscopy;  Laterality: N/A;  . KNEE ARTHROSCOPY W/ SYNOVECTOMY Left   . LAPAROSCOPIC SUPRACERVICAL HYSTERECTOMY  2009   fibroids/AUB  . TUBAL LIGATION Bilateral     Family History:  Family History  Problem Relation Age of Onset  . Pancreatic cancer Mother 15  . Throat cancer Father   . Hypertension Father   .  Hypertension Sister   . Diabetes Sister   . Breast cancer Sister 58  . Hypertension Brother   . Diabetes Maternal Aunt   . Hypertension Maternal Aunt   . Hypertension Maternal Uncle   . Diabetes Maternal Uncle     Social History:  Social History   Socioeconomic History  . Marital status: Married    Spouse name: Quillian Quince  . Number of children: 1  . Years of education: masters  . Highest education level: Not on file  Occupational History  . Occupation: Primary school teacher: [employer]  Tobacco Use  . Smoking status: Never Smoker  . Smokeless tobacco: Never Used  Vaping Use  . Vaping Use: Never used  Substance and Sexual Activity  . Alcohol use: No  . Drug use: No  . Sexual activity: Yes    Partners: Male    Birth control/protection: Surgical, Post-menopausal    Comment: Hysterectomy   Other Topics Concern  . Not on file  Social History Narrative   Has one granddaughter named " [name]" 48 years old (born 2016)   Social Determinants of Health   Financial Resource Strain:   . Difficulty of Paying Living Expenses:   Food Insecurity:   . Worried About Charity fundraiser in the Last Year:   . Arboriculturist in the Last Year:   Transportation Needs:   . Film/video editor (Medical):   Marland Kitchen Lack of Transportation (Non-Medical):   Physical Activity:   . Days of Exercise per Week:   . Minutes of Exercise per Session:   Stress:   . Feeling of Stress :   Social Connections:   . Frequency of Communication with Friends and Family:   . Frequency of Social Gatherings with Friends and Family:   . Attends Religious Services:   . Active Member of Clubs or Organizations:   . Attends Archivist Meetings:   Marland Kitchen Marital Status:   Intimate Partner Violence:   . Fear of Current or Ex-Partner:   . Emotionally Abused:   Marland Kitchen Physically Abused:   . Sexually Abused:     Allergies:  Allergies  Allergen Reactions  . Penicillins Hives and Swelling  . Sulfur Hives     Medications:  Current Outpatient Medications:  Current Outpatient Medications on File Prior to Visit  Medication Sig Dispense Refill  . atorvastatin (LIPITOR) 40 MG tablet Take 1 tablet (40 mg total) by mouth daily. 90 tablet 3  . Cholecalciferol (VITAMIN D) 2000 UNITS CAPS Take 2,000 Units by mouth daily.    . hydrocortisone 2.5 % cream Apply 1 application topically at bedtime.    Marland Kitchen losartan (COZAAR) 25 MG tablet Take 1 tablet (25 mg total) by mouth daily. 90 tablet 3  . metFORMIN (GLUCOPHAGE) 1000 MG tablet Take 1 tablet (1,000 mg total) by mouth 2 (two) times daily. 180 tablet 3  . Multiple Vitamins-Minerals (WOMENS MULTIVITAMIN PLUS PO) Take 1 tablet by mouth daily.    . ferrous fumarate (HEMOCYTE - 106 MG FE) 325 (106 FE) MG TABS tablet Take 1 tablet by mouth daily.  No current facility-administered medications on file prior to visit.     Physical Exam Vitals:BP (!) 130/80   Pulse 68   Ht 5' 6"  (1.676 m)   Wt 178 lb (80.7 kg)   BMI 28.73 kg/m  General: pleasant BF in  NAD HEENT: normocephalic, anicteric Neck: no thyroid enlargement, no palpable nodules, no cervical lymphadenopathy  Pulmonary: No increased work of breathing, CTAB Cardiovascular: RRR, without murmur Breast: Breast symmetrical, no tenderness, no palpable nodules or masses, no skin or nipple retraction present, no nipple discharge. There is a faint scaly macular rash on her outer right breast.  No axillary, infraclavicular or supraclavicular lymphadenopathy. Abdomen: Soft, non-distended, non tender  Umbilicus without lesions.  No hepatomegaly or masses palpable. No evidence of hernia. Genitourinary:  External: Normal external female genitalia.  Normal urethral meatus, normal Bartholin's and Skene's glands.    Vagina: Normal vaginal mucosa, no evidence of prolapse.    Cervix: Grossly normal in appearance, no bleeding, non-tender  Uterus: surgically absent  Adnexa: No adnexal masses, non-tender  Rectal:  deferred  Lymphatic: no evidence of inguinal lymphadenopathy Extremities: no edema, erythema, or tenderness Neurologic: Grossly intact Psychiatric: mood appropriate, affect full  Assessment: 54 y.o. G1P1001 annual gyn exam Possible fungal infection of skin left breast  Will treat with Lotrisone-BID, if unresolved to let me know and will refer to dermatology  Plan:   1) Breast cancer screening - recommend monthly self breast exam and annual mammograms Mammogram ordered. Due after 05/23/2020  2) Cervical cancer screening - Pap was done. ASCCP guidelines and rational discussed.  Patient opts for yearly screening interval  3) Colon cancer screening-Colonoscopy done 2016. She desires referral back to GI  4) Routine healthcare maintenance  managed by PCP   5) Discussed calcium requirements. Does take a vitamin D3 supplement.  6) RTO for annual in 1 year.  Dalia Heading, CNM

## 2020-05-15 ENCOUNTER — Ambulatory Visit (INDEPENDENT_AMBULATORY_CARE_PROVIDER_SITE_OTHER): Payer: PRIVATE HEALTH INSURANCE | Admitting: Certified Nurse Midwife

## 2020-05-15 ENCOUNTER — Encounter: Payer: Self-pay | Admitting: Certified Nurse Midwife

## 2020-05-15 ENCOUNTER — Other Ambulatory Visit: Payer: Self-pay

## 2020-05-15 ENCOUNTER — Other Ambulatory Visit (HOSPITAL_COMMUNITY)
Admission: RE | Admit: 2020-05-15 | Discharge: 2020-05-15 | Disposition: A | Discharge status: Home / Self Care | Payer: Self-pay | Source: Ambulatory Visit | Attending: Certified Nurse Midwife | Admitting: Certified Nurse Midwife

## 2020-05-15 VITALS — BP 130/80 | HR 68 | Ht 66.0 in | Wt 178.0 lb

## 2020-05-15 DIAGNOSIS — Z124 Encounter for screening for malignant neoplasm of cervix: Secondary | ICD-10-CM | POA: Diagnosis not present

## 2020-05-15 DIAGNOSIS — R21 Rash and other nonspecific skin eruption: Secondary | ICD-10-CM | POA: Diagnosis not present

## 2020-05-15 DIAGNOSIS — Z1211 Encounter for screening for malignant neoplasm of colon: Secondary | ICD-10-CM

## 2020-05-15 MED ORDER — CLOTRIMAZOLE-BETAMETHASONE 1-0.05 % EX CREA: TOPICAL_CREAM | CUTANEOUS | 0 refills | Status: DC

## 2020-05-16 ENCOUNTER — Ambulatory Visit (INDEPENDENT_AMBULATORY_CARE_PROVIDER_SITE_OTHER): Payer: PRIVATE HEALTH INSURANCE | Admitting: Family Medicine

## 2020-05-16 ENCOUNTER — Encounter: Payer: Self-pay | Admitting: Family Medicine

## 2020-05-16 VITALS — BP 116/74 | HR 78 | Temp 96.9°F | Resp 16 | Ht 66.0 in | Wt 178.0 lb

## 2020-05-16 DIAGNOSIS — I10 Essential (primary) hypertension: Secondary | ICD-10-CM | POA: Diagnosis not present

## 2020-05-16 DIAGNOSIS — E119 Type 2 diabetes mellitus without complications: Secondary | ICD-10-CM

## 2020-05-16 DIAGNOSIS — E7800 Pure hypercholesterolemia, unspecified: Secondary | ICD-10-CM

## 2020-05-16 LAB — POCT GLYCOSYLATED HEMOGLOBIN (HGB A1C)
Est. average glucose Bld gHb Est-mCnc: 169
Hemoglobin A1C: 7.5 % — AB (ref 4.0–5.6)

## 2020-05-16 MED ORDER — FREESTYLE LIBRE 2 SENSOR MISC: 1.0000 | Freq: Once | 0 refills | Status: AC

## 2020-05-16 MED ORDER — FREESTYLE LIBRE 2 READER DEVI: 1.0000 | Freq: Once | 0 refills | Status: AC

## 2020-05-17 LAB — CYTOLOGY - PAP: Diagnosis: NEGATIVE

## 2020-05-26 ENCOUNTER — Encounter: Payer: Self-pay | Admitting: Certified Nurse Midwife

## 2020-06-04 ENCOUNTER — Ambulatory Visit
Admission: RE | Admit: 2020-06-04 | Discharge: 2020-06-04 | Disposition: A | Discharge status: Home / Self Care | Payer: PRIVATE HEALTH INSURANCE | Source: Ambulatory Visit | Attending: Family Medicine | Admitting: Family Medicine

## 2020-06-04 ENCOUNTER — Encounter: Payer: Self-pay | Admitting: Family Medicine

## 2020-06-04 ENCOUNTER — Other Ambulatory Visit: Payer: Self-pay

## 2020-06-04 DIAGNOSIS — Z1231 Encounter for screening mammogram for malignant neoplasm of breast: Secondary | ICD-10-CM | POA: Diagnosis present

## 2020-06-05 ENCOUNTER — Other Ambulatory Visit: Payer: Self-pay

## 2020-06-05 ENCOUNTER — Telehealth: Payer: Self-pay

## 2020-06-05 DIAGNOSIS — Z111 Encounter for screening for respiratory tuberculosis: Secondary | ICD-10-CM

## 2020-06-05 DIAGNOSIS — Z789 Other specified health status: Secondary | ICD-10-CM

## 2020-06-05 NOTE — Progress Notes (Signed)
Patient is needing screenings for form she is needing to be filled out for [employer].

## 2020-06-05 NOTE — Telephone Encounter (Signed)
Called to advise patient that I have ordered quantiferon and MMR immunity labs that she needs for Dr. Rosanna Randy to be able to sign off on form. Told patient that I will leave lab order slip on [address] side for her to have drawn.

## 2020-06-11 LAB — QUANTIFERON-TB GOLD PLUS
QuantiFERON Mitogen Value: >10 IU/mL
QuantiFERON Nil Value: 0.06 IU/mL
QuantiFERON TB1 Ag Value: 0.06 IU/mL
QuantiFERON TB2 Ag Value: 0.05 IU/mL
QuantiFERON-TB Gold Plus: NEGATIVE

## 2020-06-11 LAB — MEASLES/MUMPS/RUBELLA IMMUNITY
MUMPS ABS, IGG: 70.2 AU/mL (ref >10.9)
RUBEOLA AB, IGG: <13.5 AU/mL — ABNORMAL LOW (ref >16.4)
Rubella Antibodies, IGG: <0.9 index — ABNORMAL LOW (ref >0.99)

## 2020-06-12 ENCOUNTER — Encounter: Payer: Self-pay | Admitting: Family Medicine

## 2020-06-13 ENCOUNTER — Encounter: Payer: Self-pay | Admitting: Family Medicine

## 2020-06-14 NOTE — Progress Notes (Addendum)
I,Jacqueline Romero,acting as a scribe for Wilhemena Durie, MD.,have documented all relevant documentation on the behalf of Wilhemena Durie, MD,as directed by  Wilhemena Durie, MD while in the presence of Wilhemena Durie, MD.   Complete physical exam   Patient: Jacqueline Romero   DOB: 1965/10/22   54 y.o. Female  MRN: [id number] Visit Date: 06/19/2020  Today's healthcare provider: Wilhemena Durie, MD   Chief Complaint  Patient presents with  . Annual Exam   Subjective    Jacqueline Romero is a 54 y.o. female who presents today for a complete physical exam.  She reports consuming a general diet. Home exercise routine includes walking. She generally feels well. She reports sleeping well. She does not have additional problems to discuss today.  Pt has had pelvic and breast exam.  HPI  She does c/o of chronic constipation and wishes to see GI.  Past Medical History:  Diagnosis Date  . Anemia   . BRCA negative 12/2019   MyRisk neg except CDKN2A and 3-MSH3 VUS; IBIS=16.1%  . Chronic kidney disease   . Diabetes mellitus without complication ([place])   . Diverticulosis   . Duodenitis   . Family history of pancreatic cancer   . Heart murmur   . Hypertension   . Migraine    Past Surgical History:  Procedure Laterality Date  . CHOLECYSTECTOMY     laparscopic-Dr Byrnett  . COLONOSCOPY WITH PROPOFOL N/A 04/15/2015   Procedure: COLONOSCOPY WITH PROPOFOL;  Surgeon: Lollie Sails, MD;  Location: [facility] ENDOSCOPY;  Service: Endoscopy;  Laterality: N/A;  . ESOPHAGOGASTRODUODENOSCOPY N/A 04/15/2015   Procedure: ESOPHAGOGASTRODUODENOSCOPY (EGD);  Surgeon: Lollie Sails, MD;  Location: [facility] ENDOSCOPY;  Service: Endoscopy;  Laterality: N/A;  . KNEE ARTHROSCOPY W/ SYNOVECTOMY Left   . LAPAROSCOPIC SUPRACERVICAL HYSTERECTOMY  2009   fibroids/AUB  . TUBAL LIGATION Bilateral    Social History   Socioeconomic History  . Marital status: Married    Spouse name: Quillian Quince  .  Number of children: 1  . Years of education: masters  . Highest education level: Not on file  Occupational History  . Occupation: Primary school teacher: [employer]  Tobacco Use  . Smoking status: Never Smoker  . Smokeless tobacco: Never Used  Vaping Use  . Vaping Use: Never used  Substance and Sexual Activity  . Alcohol use: No  . Drug use: No  . Sexual activity: Yes    Partners: Male    Birth control/protection: Surgical, Post-menopausal    Comment: Hysterectomy   Other Topics Concern  . Not on file  Social History Narrative   Has one granddaughter named " [name]" 80 years old (born 2016)   Social Determinants of Health   Financial Resource Strain:   . Difficulty of Paying Living Expenses: Not on file  Food Insecurity:   . Worried About Charity fundraiser in the Last Year: Not on file  . Ran Out of Food in the Last Year: Not on file  Transportation Needs:   . Lack of Transportation (Medical): Not on file  . Lack of Transportation (Non-Medical): Not on file  Physical Activity:   . Days of Exercise per Week: Not on file  . Minutes of Exercise per Session: Not on file  Stress:   . Feeling of Stress : Not on file  Social Connections:   . Frequency of Communication with Friends and Family: Not on file  . Frequency of  Social Gatherings with Friends and Family: Not on file  . Attends Religious Services: Not on file  . Active Member of Clubs or Organizations: Not on file  . Attends Archivist Meetings: Not on file  . Marital Status: Not on file  Intimate Partner Violence:   . Fear of Current or Ex-Partner: Not on file  . Emotionally Abused: Not on file  . Physically Abused: Not on file  . Sexually Abused: Not on file   Family Status  Relation Name Status  . Mother  Deceased at age 70  . Father  Deceased at age 31  . Sister [employer]  . Brother  Alive  . Mat Aunt  Deceased  . Mat Uncle  Deceased   Family History  Problem Relation Age of  Onset  . Pancreatic cancer Mother 85  . Throat cancer Father   . Hypertension Father   . Hypertension Sister   . Diabetes Sister   . Breast cancer Sister 26  . Hypertension Brother   . Diabetes Maternal Aunt   . Hypertension Maternal Aunt   . Hypertension Maternal Uncle   . Diabetes Maternal Uncle    Allergies  Allergen Reactions  . Penicillins Hives and Swelling  . Sulfur Hives    Patient Care Team: [name]., MD as PCP - General (Family Medicine)   Medications: Outpatient Medications Prior to Visit  Medication Sig  . atorvastatin (LIPITOR) 40 MG tablet Take 1 tablet (40 mg total) by mouth daily.  . Cholecalciferol (VITAMIN D) 2000 UNITS CAPS Take 2,000 Units by mouth daily.  . clotrimazole-betamethasone (LOTRISONE) cream Apply externally BID prn sx up to 2 wks  . ferrous fumarate (HEMOCYTE - 106 MG FE) 325 (106 FE) MG TABS tablet Take 1 tablet by mouth daily.   . hydrocortisone 2.5 % cream Apply 1 application topically at bedtime.  Marland Kitchen losartan (COZAAR) 25 MG tablet Take 1 tablet (25 mg total) by mouth daily.  . metFORMIN (GLUCOPHAGE) 1000 MG tablet Take 1 tablet (1,000 mg total) by mouth 2 (two) times daily.  . Multiple Vitamins-Minerals (WOMENS MULTIVITAMIN PLUS PO) Take 1 tablet by mouth daily.   No facility-administered medications prior to visit.    Review of Systems  All other systems reviewed and are negative.      Objective    BP 123/75 (BP Location: Right Arm, Patient Position: Sitting, Cuff Size: Large)   Pulse 64   Temp 98.3 F (36.8 C) (Other (Comment))   Resp 16   Ht _0  (1.676 m)   Wt 182 lb (82.6 kg)   SpO2 98%   BMI 29.38 kg/m  BP Readings from Last 3 Encounters:  06/19/20 123/75  05/16/20 116/74  05/15/20 (!) 130/80   Wt Readings from Last 3 Encounters:  06/19/20 182 lb (82.6 kg)  05/16/20 178 lb (80.7 kg)  05/15/20 178 lb (80.7 kg)      Physical Exam Vitals reviewed.  Constitutional:      Appearance: She is  well-developed.  HENT:     Head: Normocephalic and atraumatic.     Right Ear: External ear normal.     Left Ear: External ear normal.     Nose: Nose normal.     Mouth/Throat:     Pharynx: Oropharynx is clear.  Eyes:     General: No scleral icterus.    Conjunctiva/sclera: Conjunctivae normal.  Neck:     Thyroid: No thyromegaly.  Cardiovascular:     Rate and Rhythm:  Normal rate and regular rhythm.     Heart sounds: Normal heart sounds.  Pulmonary:     Effort: Pulmonary effort is normal.     Breath sounds: Normal breath sounds.  Abdominal:     Palpations: Abdomen is soft.  Musculoskeletal:     Cervical back: Neck supple.  Lymphadenopathy:     Cervical: No cervical adenopathy.  Skin:    General: Skin is warm and dry.  Neurological:     General: No focal deficit present.     Mental Status: She is alert and oriented to person, place, and time.  Psychiatric:        Mood and Affect: Mood normal.        Behavior: Behavior normal.        Thought Content: Thought content normal.        Judgment: Judgment normal.       Last depression screening scores PHQ 2/9 Scores 06/19/2020 01/15/2020 06/16/2017  PHQ - 2 Score 0 0 0  PHQ- 9 Score 0 0 0   Last fall risk screening Fall Risk  01/15/2020  Falls in the past year? 0  Number falls in past yr: 0  Injury with Fall? 0  Follow up Falls evaluation completed   Last Audit-C alcohol use screening Alcohol Use Disorder Test (AUDIT) 06/19/2020  1. How often do you have a drink containing alcohol? 0  2. How many drinks containing alcohol do you have on a typical day when you are drinking? 0  3. How often do you have six or more drinks on one occasion? 0  AUDIT-C Score 0  Alcohol Brief Interventions/Follow-up AUDIT Score <7 follow-up not indicated   A score of 3 or more in women, and 4 or more in men indicates increased risk for alcohol abuse, EXCEPT if all of the points are from question 1   No results found for any visits on 06/19/20.   Assessment & Plan    Routine Health Maintenance and Physical Exam  Exercise Activities and Dietary recommendations Goals   None     Immunization History  Administered Date(s) Administered  . Hepatitis B 05/25/2013, 06/30/2013  . Moderna SARS-COVID-2 Vaccination 01/06/2020, 01/20/2020  . Pneumococcal Polysaccharide-23 06/16/2017  . Td 07/27/1997, 10/31/2012    Health Maintenance  Topic Date Due  . Hepatitis C Screening  Never done  . HIV Screening  Never done  . OPHTHALMOLOGY EXAM  02/16/2018  . FOOT EXAM  06/16/2018  . INFLUENZA VACCINE  Never done  . HEMOGLOBIN A1C  11/16/2020  . MAMMOGRAM  06/04/2022  . TETANUS/TDAP  10/31/2022  . PAP SMEAR-Modifier  05/16/2023  . COLONOSCOPY  04/14/2025  . PNEUMOCOCCAL POLYSACCHARIDE VACCINE AGE 69-64 HIGH RISK  Completed  . COVID-19 Vaccine  Completed    Discussed health benefits of physical activity, and encouraged her to engage in regular exercise appropriate for her age and condition.  1. Annual physical exam   2. Pure hypercholesterolemia  - rosuvastatin (CRESTOR) 10 MG tablet; Take 1 tablet (10 mg total) by mouth daily.  Dispense: 90 tablet; Refill: 3  3. Chronic idiopathic constipation  - Ambulatory referral to Gastroenterology  4. Chronic abdominal pain - Ambulatory referral to Gastroenterology 5.T2DM  No follow-ups on file.     I, Wilhemena Durie, MD, have reviewed all documentation for this visit. The documentation on 07/03/20 for the exam, diagnosis, procedures, and orders are all accurate and complete.    Ashe Graybeal Cranford Mon, MD  [facility] [phone] (phone)  [phone] (fax)  [place]

## 2020-06-19 ENCOUNTER — Encounter: Payer: Self-pay | Admitting: Family Medicine

## 2020-06-19 ENCOUNTER — Ambulatory Visit (INDEPENDENT_AMBULATORY_CARE_PROVIDER_SITE_OTHER): Payer: PRIVATE HEALTH INSURANCE | Admitting: Family Medicine

## 2020-06-19 ENCOUNTER — Other Ambulatory Visit: Payer: Self-pay

## 2020-06-19 VITALS — BP 123/75 | HR 64 | Temp 98.3°F | Resp 16 | Ht 66.0 in | Wt 182.0 lb

## 2020-06-19 DIAGNOSIS — R109 Unspecified abdominal pain: Secondary | ICD-10-CM | POA: Diagnosis not present

## 2020-06-19 DIAGNOSIS — G8929 Other chronic pain: Secondary | ICD-10-CM

## 2020-06-19 DIAGNOSIS — E7800 Pure hypercholesterolemia, unspecified: Secondary | ICD-10-CM

## 2020-06-19 DIAGNOSIS — K5904 Chronic idiopathic constipation: Secondary | ICD-10-CM

## 2020-06-19 DIAGNOSIS — E119 Type 2 diabetes mellitus without complications: Secondary | ICD-10-CM

## 2020-06-19 DIAGNOSIS — Z0000 Encounter for general adult medical examination without abnormal findings: Secondary | ICD-10-CM | POA: Diagnosis not present

## 2020-06-19 MED ORDER — ROSUVASTATIN CALCIUM 10 MG PO TABS: 10.0000 mg | ORAL_TABLET | Freq: Every day | ORAL | 3 refills | Status: DC

## 2020-07-18 ENCOUNTER — Encounter: Payer: Self-pay | Admitting: Family Medicine

## 2020-08-26 ENCOUNTER — Encounter: Payer: Self-pay | Admitting: Family Medicine

## 2020-08-28 LAB — HM DIABETES EYE EXAM

## 2020-09-19 ENCOUNTER — Ambulatory Visit: Payer: Self-pay | Admitting: Family Medicine

## 2020-09-30 NOTE — Progress Notes (Signed)
I,Jacqueline Romero,acting as a scribe for Jacqueline Mans, MD.,have documented all relevant documentation on the behalf of Jacqueline Mans, MD,as directed by  Jacqueline Mans, MD while in the presence of Jacqueline Mans, MD.   Established patient visit   Patient: Jacqueline Romero   DOB: Jun 12, 1966   54 y.o. Female  MRN: [id number] Visit Date: 10/01/2020  Today's healthcare provider: [name], MD   Chief Complaint  Patient presents with   Follow-up   Diabetes   Hypertension   Hyperlipidemia   Subjective    HPI  Patient comes in today for follow-up of her diabetes. She feels well. She has an outbreak on her face that is some sort of response to topical that she used. Diabetes Mellitus Type II, follow-up  Lab Results  Component Value Date   HGBA1C 7.5 (A) 05/16/2020   HGBA1C 7.9 (H) 09/04/2019   HGBA1C 7.6 (H) 08/01/2018   Last seen for diabetes 3 months ago.  Management since then includes continuing the same treatment. She reports good compliance with treatment. She is not having side effects. none  Home blood sugar records: fasting range: not checking  Episodes of hypoglycemia? No none   Current insulin regiment: n/a Most Recent Eye Exam: 08/2020  --------------------------------------------------------------------  Hypertension, follow-up  BP Readings from Last 3 Encounters:  10/01/20 109/75  06/19/20 123/75  05/16/20 116/74   Wt Readings from Last 3 Encounters:  10/01/20 184 lb (83.5 kg)  06/19/20 182 lb (82.6 kg)  05/16/20 178 lb (80.7 kg)     She was last seen for hypertension 3 months ago.  BP at that visit was 123/75. Management since that visit includes; on losartan. She reports good compliance with treatment. She is having side effects. none She is not exercising. She is adherent to low salt diet.   Outside blood pressures are not checking.  She does not smoke.  Use of agents associated with hypertension: none.    --------------------------------------------------------------------  Lipid/Cholesterol, follow-up  Last Lipid Panel: Lab Results  Component Value Date   CHOL 159 09/04/2019   LDLCALC 92 09/04/2019   HDL 55 09/04/2019   TRIG 59 09/04/2019    She was last seen for this 3 months ago.  Management since that visit included; on rosuvastatin. She reports good compliance with treatment. She is not having side effects. none She is following a Regular diet. Current exercise: none  Last metabolic panel Lab Results  Component Value Date   GLUCOSE 116 (H) 09/04/2019   NA 145 (H) 09/04/2019   K 4.1 09/04/2019   BUN 11 09/04/2019   CREATININE 0.87 09/04/2019   GFRNONAA 76 09/04/2019   GFRAA 88 09/04/2019   CALCIUM 9.3 09/04/2019   AST 16 09/04/2019   ALT 14 09/04/2019   The 10-year ASCVD risk score Jacqueline Romero DC Jr., et al., 2013) is: 5.4%  --------------------------------------------------------------------       Medications: Outpatient Medications Prior to Visit  Medication Sig   Cholecalciferol (VITAMIN D) 2000 UNITS CAPS Take 2,000 Units by mouth daily.   clotrimazole-betamethasone (LOTRISONE) cream Apply externally BID prn sx up to 2 wks   ferrous fumarate (HEMOCYTE - 106 MG FE) 325 (106 FE) MG TABS tablet Take 1 tablet by mouth daily.    Multiple Vitamins-Minerals (WOMENS MULTIVITAMIN PLUS PO) Take 1 tablet by mouth daily.   [DISCONTINUED] atorvastatin (LIPITOR) 40 MG tablet Take 1 tablet (40 mg total) by mouth daily.   [DISCONTINUED] hydrocortisone 2.5 % cream Apply  1 application topically at bedtime.   [DISCONTINUED] losartan (COZAAR) 25 MG tablet Take 1 tablet (25 mg total) by mouth daily.   [DISCONTINUED] metFORMIN (GLUCOPHAGE) 1000 MG tablet Take 1 tablet (1,000 mg total) by mouth 2 (two) times daily.   [DISCONTINUED] rosuvastatin (CRESTOR) 10 MG tablet Take 1 tablet (10 mg total) by mouth daily.   No facility-administered medications prior to visit.     Review of Systems  Constitutional: Negative for appetite change, chills, fatigue and fever.  Respiratory: Negative for chest tightness and shortness of breath.   Cardiovascular: Negative for chest pain and palpitations.  Gastrointestinal: Negative for abdominal pain, nausea and vomiting.  Neurological: Negative for dizziness and weakness.    Last hemoglobin A1c Lab Results  Component Value Date   HGBA1C 7.9 (H) 10/01/2020      Objective    BP 109/75 (BP Location: Left Arm, Patient Position: Sitting, Cuff Size: Large)    Pulse 72    Temp 98.2 F (36.8 C) (Oral)    Resp 16    Ht 5\' 6"  (1.676 m)    Wt 184 lb (83.5 kg)    SpO2 100%    BMI 29.70 kg/m  BP Readings from Last 3 Encounters:  10/01/20 109/75  06/19/20 123/75  05/16/20 116/74   Wt Readings from Last 3 Encounters:  10/01/20 184 lb (83.5 kg)  06/19/20 182 lb (82.6 kg)  05/16/20 178 lb (80.7 kg)      Physical Exam Vitals reviewed.  Constitutional:      Appearance: She is well-developed.  HENT:     Head: Normocephalic.     Right Ear: External ear normal.     Left Ear: External ear normal.     Nose: Nose normal.  Eyes:     General: No scleral icterus.    Conjunctiva/sclera: Conjunctivae normal.  Neck:     Thyroid: No thyromegaly.  Cardiovascular:     Rate and Rhythm: Normal rate and regular rhythm.     Heart sounds: Normal heart sounds.  Pulmonary:     Effort: Pulmonary effort is normal.     Breath sounds: Normal breath sounds.  Abdominal:     Palpations: Abdomen is soft.  Lymphadenopathy:     Cervical: No cervical adenopathy.  Skin:    General: Skin is warm and dry.     Comments: Mild acneiform outbreak on the face. Possible some rosacea in the malar region.  Neurological:     General: No focal deficit present.     Mental Status: She is alert and oriented to person, place, and time.     Comments: Normal diabetic monofilament exam of feet.  Psychiatric:        Mood and Affect: Mood normal.         Behavior: Behavior normal.        Thought Content: Thought content normal.        Judgment: Judgment normal.       No results found for any visits on 10/01/20.  Assessment & Plan     1. Type 2 diabetes mellitus without complication, without long-term current use of insulin (HCC)  - Lipid panel - TSH - Hemoglobin A1c - CBC w/Diff/Platelet - Comprehensive Metabolic Panel (CMET)  2. Essential hypertension  - Lipid panel - TSH - Hemoglobin A1c - CBC w/Diff/Platelet - Comprehensive Metabolic Panel (CMET)  3. Pure hypercholesterolemia  - Lipid panel - TSH - Hemoglobin A1c - CBC w/Diff/Platelet - Comprehensive Metabolic Panel (CMET) - atorvastatin (LIPITOR) 40  MG tablet; Take 1 tablet (40 mg total) by mouth daily.  Dispense: 90 tablet; Refill: 3 - rosuvastatin (CRESTOR) 10 MG tablet; Take 1 tablet (10 mg total) by mouth daily.  Dispense: 90 tablet; Refill: 3  4. Acne, unspecified acne type Treat with topical hydrocortisone presently. Think this will be a self-limited problem. 5. Rosacea keratitis May  need to add topical MetroGel.  6. Dermatitis    Return in about 4 months (around 01/30/2021).         Estie Sproule Wendelyn Breslow, MD  [facility] [phone] (phone) [phone] (fax)  [facility]

## 2020-10-01 ENCOUNTER — Encounter: Payer: Self-pay | Admitting: Family Medicine

## 2020-10-01 ENCOUNTER — Ambulatory Visit (INDEPENDENT_AMBULATORY_CARE_PROVIDER_SITE_OTHER): Payer: PRIVATE HEALTH INSURANCE | Admitting: Family Medicine

## 2020-10-01 ENCOUNTER — Other Ambulatory Visit: Payer: Self-pay

## 2020-10-01 VITALS — BP 109/75 | HR 72 | Temp 98.2°F | Resp 16 | Ht 66.0 in | Wt 184.0 lb

## 2020-10-01 DIAGNOSIS — I10 Essential (primary) hypertension: Secondary | ICD-10-CM | POA: Diagnosis not present

## 2020-10-01 DIAGNOSIS — E119 Type 2 diabetes mellitus without complications: Secondary | ICD-10-CM | POA: Diagnosis not present

## 2020-10-01 DIAGNOSIS — L309 Dermatitis, unspecified: Secondary | ICD-10-CM

## 2020-10-01 DIAGNOSIS — E7800 Pure hypercholesterolemia, unspecified: Secondary | ICD-10-CM

## 2020-10-01 DIAGNOSIS — L718 Other rosacea: Secondary | ICD-10-CM

## 2020-10-01 DIAGNOSIS — L709 Acne, unspecified: Secondary | ICD-10-CM

## 2020-10-01 MED ORDER — METFORMIN HCL 1000 MG PO TABS
1000.0000 mg | ORAL_TABLET | Freq: Two times a day (BID) | ORAL | 3 refills | Status: DC
Start: 2020-10-01 — End: 2022-01-07

## 2020-10-01 MED ORDER — LOSARTAN POTASSIUM 25 MG PO TABS: 25.0000 mg | ORAL_TABLET | Freq: Every day | ORAL | 3 refills | Status: DC

## 2020-10-01 MED ORDER — ATORVASTATIN CALCIUM 40 MG PO TABS: 40.0000 mg | ORAL_TABLET | Freq: Every day | ORAL | 3 refills | Status: DC

## 2020-10-01 MED ORDER — HYDROCORTISONE 2.5 % EX CREA: 1.0000 "application " | TOPICAL_CREAM | Freq: Every day | CUTANEOUS | 0 refills | Status: DC

## 2020-10-01 MED ORDER — ROSUVASTATIN CALCIUM 10 MG PO TABS: 10.0000 mg | ORAL_TABLET | Freq: Every day | ORAL | 3 refills | Status: DC

## 2020-10-02 ENCOUNTER — Encounter: Payer: Self-pay | Admitting: *Deleted

## 2020-10-02 LAB — LIPID PANEL
Chol/HDL Ratio: 3.1 ratio (ref 0.0–4.4)
Cholesterol, Total: 191 mg/dL (ref 100–199)
HDL: 61 mg/dL (ref >39)
LDL Chol Calc (NIH): 116 mg/dL — ABNORMAL HIGH (ref 0–99)
Triglycerides: 76 mg/dL (ref 0–149)
VLDL Cholesterol Cal: 14 mg/dL (ref 5–40)

## 2020-10-02 LAB — CBC WITH DIFFERENTIAL/PLATELET
Basophils Absolute: 0 10*3/uL (ref 0.0–0.2)
Basos: 1 %
EOS (ABSOLUTE): 0.1 10*3/uL (ref 0.0–0.4)
Eos: 3 %
Hematocrit: 41.7 % (ref 34.0–46.6)
Hemoglobin: 14.1 g/dL (ref 11.1–15.9)
Immature Grans (Abs): 0 10*3/uL (ref 0.0–0.1)
Immature Granulocytes: 0 %
Lymphocytes Absolute: 1.9 10*3/uL (ref 0.7–3.1)
Lymphs: 47 %
MCH: 29.3 pg (ref 26.6–33.0)
MCHC: 33.8 g/dL (ref 31.5–35.7)
MCV: 87 fL (ref 79–97)
Monocytes Absolute: 0.4 10*3/uL (ref 0.1–0.9)
Monocytes: 9 %
Neutrophils Absolute: 1.6 10*3/uL (ref 1.4–7.0)
Neutrophils: 40 %
Platelets: 212 10*3/uL (ref 150–450)
RBC: 4.82 x10E6/uL (ref 3.77–5.28)
RDW: 11.8 % (ref 11.7–15.4)
WBC: 3.9 10*3/uL (ref 3.4–10.8)

## 2020-10-02 LAB — COMPREHENSIVE METABOLIC PANEL
ALT: 11 IU/L (ref 0–32)
AST: 13 IU/L (ref 0–40)
Albumin/Globulin Ratio: 1.7 (ref 1.2–2.2)
Albumin: 4.3 g/dL (ref 3.8–4.9)
Alkaline Phosphatase: 70 IU/L (ref 44–121)
BUN/Creatinine Ratio: 15 (ref 9–23)
BUN: 13 mg/dL (ref 6–24)
Bilirubin Total: 0.3 mg/dL (ref 0.0–1.2)
CO2: 21 mmol/L (ref 20–29)
Calcium: 9.3 mg/dL (ref 8.7–10.2)
Chloride: 104 mmol/L (ref 96–106)
Creatinine, Ser: 0.87 mg/dL (ref 0.57–1.00)
GFR calc Af Amer: 87 mL/min/{1.73_m2} (ref >59)
GFR calc non Af Amer: 75 mL/min/{1.73_m2} (ref >59)
Globulin, Total: 2.6 g/dL (ref 1.5–4.5)
Glucose: 147 mg/dL — ABNORMAL HIGH (ref 65–99)
Potassium: 4.2 mmol/L (ref 3.5–5.2)
Sodium: 140 mmol/L (ref 134–144)
Total Protein: 6.9 g/dL (ref 6.0–8.5)

## 2020-10-02 LAB — HEMOGLOBIN A1C
Est. average glucose Bld gHb Est-mCnc: 180 mg/dL
Hgb A1c MFr Bld: 7.9 % — ABNORMAL HIGH (ref 4.8–5.6)

## 2020-10-02 LAB — TSH: TSH: 1.48 u[IU]/mL (ref 0.450–4.500)

## 2021-01-30 ENCOUNTER — Ambulatory Visit: Payer: Self-pay | Admitting: Family Medicine

## 2021-02-04 ENCOUNTER — Other Ambulatory Visit: Payer: Self-pay

## 2021-02-04 ENCOUNTER — Ambulatory Visit: Payer: BC Managed Care – PPO | Admitting: Family Medicine

## 2021-02-04 ENCOUNTER — Encounter: Payer: Self-pay | Admitting: Family Medicine

## 2021-02-04 VITALS — BP 128/80 | HR 83 | Resp 16 | Ht 66.0 in | Wt 181.0 lb

## 2021-02-04 DIAGNOSIS — E119 Type 2 diabetes mellitus without complications: Secondary | ICD-10-CM

## 2021-02-04 DIAGNOSIS — E7800 Pure hypercholesterolemia, unspecified: Secondary | ICD-10-CM

## 2021-02-04 DIAGNOSIS — I10 Essential (primary) hypertension: Secondary | ICD-10-CM

## 2021-02-04 LAB — POCT GLYCOSYLATED HEMOGLOBIN (HGB A1C): Hemoglobin A1C: 7.7 % — AB (ref 4.0–5.6)

## 2021-02-04 NOTE — Progress Notes (Signed)
Established patient visit   Patient: Jacqueline Romero   DOB: May 03, 1966   55 y.o. Female  MRN: [id number] Visit Date: 02/04/2021  Today's healthcare provider: [name], MD   Chief Complaint  Patient presents with  . Diabetes  . Hypertension   Subjective    HPI  Patient feels well and has no complaints.  She is taking her medications as prescribed. Diabetes Mellitus Type II, Follow-up  Lab Results  Component Value Date   HGBA1C 7.7 (A) 02/04/2021   HGBA1C 7.9 (H) 10/01/2020   HGBA1C 7.5 (A) 05/16/2020   Wt Readings from Last 3 Encounters:  02/04/21 181 lb (82.1 kg)  10/01/20 184 lb (83.5 kg)  06/19/20 182 lb (82.6 kg)   Last seen for diabetes 4 months ago.  Management since then includes no changes. She reports good compliance with treatment. She is not having side effects.  Symptoms: No fatigue No foot ulcerations  No appetite changes No nausea  No paresthesia of the feet  No polydipsia  No polyuria No visual disturbances   No vomiting     Home blood sugar records: trend: stable  Episodes of hypoglycemia? No    Current insulin regiment: none Most Recent Eye Exam: 08/28/2020 Current exercise: no regular exercise Current diet habits: well balanced  Pertinent Labs: Lab Results  Component Value Date   CHOL 191 10/01/2020   HDL 61 10/01/2020   LDLCALC 116 (H) 10/01/2020   TRIG 76 10/01/2020   CHOLHDL 3.1 10/01/2020   Lab Results  Component Value Date   NA 140 10/01/2020   K 4.2 10/01/2020   CREATININE 0.87 10/01/2020   GFRNONAA 75 10/01/2020   GFRAA 87 10/01/2020   GLUCOSE 147 (H) 10/01/2020     Hypertension, follow-up  BP Readings from Last 3 Encounters:  02/04/21 128/80  10/01/20 109/75  06/19/20 123/75   Wt Readings from Last 3 Encounters:  02/04/21 181 lb (82.1 kg)  10/01/20 184 lb (83.5 kg)  06/19/20 182 lb (82.6 kg)     She was last seen for hypertension 4 months ago.  BP at that visit was 109/75. Management since  that visit includes no changes.  She reports good compliance with treatment. She is not having side effects.  She is following a Regular diet. She is not exercising. She does not smoke.  Use of agents associated with hypertension: none.   Outside blood pressures are not being checked. Symptoms: No chest pain No chest pressure  No palpitations No syncope  No dyspnea No orthopnea  No paroxysmal nocturnal dyspnea No lower extremity edema   Pertinent labs: Lab Results  Component Value Date   CHOL 191 10/01/2020   HDL 61 10/01/2020   LDLCALC 116 (H) 10/01/2020   TRIG 76 10/01/2020   CHOLHDL 3.1 10/01/2020   Lab Results  Component Value Date   NA 140 10/01/2020   K 4.2 10/01/2020   CREATININE 0.87 10/01/2020   GFRNONAA 75 10/01/2020   GFRAA 87 10/01/2020   GLUCOSE 147 (H) 10/01/2020     The 10-year ASCVD risk score [name], et al., 2013) is: 9.9%   ---------------------------------------------------------------------------------------------------      Medications: Outpatient Medications Prior to Visit  Medication Sig  . atorvastatin (LIPITOR) 40 MG tablet Take 1 tablet (40 mg total) by mouth daily.  . Cholecalciferol (VITAMIN D) 2000 UNITS CAPS Take 2,000 Units by mouth daily.  . clotrimazole-betamethasone (LOTRISONE) cream Apply externally BID prn sx up to 2 wks  .  ferrous fumarate (HEMOCYTE - 106 MG FE) 325 (106 FE) MG TABS tablet Take 1 tablet by mouth daily.   . hydrocortisone 2.5 % cream Apply 1 application topically at bedtime.  Marland Kitchen losartan (COZAAR) 25 MG tablet Take 1 tablet (25 mg total) by mouth daily.  . metFORMIN (GLUCOPHAGE) 1000 MG tablet Take 1 tablet (1,000 mg total) by mouth 2 (two) times daily.  . Multiple Vitamins-Minerals (WOMENS MULTIVITAMIN PLUS PO) Take 1 tablet by mouth daily.  . rosuvastatin (CRESTOR) 10 MG tablet Take 1 tablet (10 mg total) by mouth daily.   No facility-administered medications prior to visit.    Review of Systems   Constitutional: Negative for activity change and fatigue.  Respiratory: Negative for cough and shortness of breath.   Cardiovascular: Negative for chest pain and leg swelling.  Endocrine: Negative for cold intolerance, heat intolerance, polydipsia, polyphagia and polyuria.  Musculoskeletal: Negative for arthralgias and myalgias.  Neurological: Negative for dizziness and light-headedness.        Objective    BP 128/80   Pulse 83   Resp 16   Ht 5\' 6"  (1.676 m)   Wt 181 lb (82.1 kg)   BMI 29.21 kg/m  BP Readings from Last 3 Encounters:  02/04/21 128/80  10/01/20 109/75  06/19/20 123/75   Wt Readings from Last 3 Encounters:  02/04/21 181 lb (82.1 kg)  10/01/20 184 lb (83.5 kg)  06/19/20 182 lb (82.6 kg)       Physical Exam Vitals reviewed.  Constitutional:      Appearance: She is well-developed.  HENT:     Head: Normocephalic.     Right Ear: External ear normal.     Left Ear: External ear normal.     Nose: Nose normal.  Eyes:     General: No scleral icterus.    Conjunctiva/sclera: Conjunctivae normal.  Neck:     Thyroid: No thyromegaly.  Cardiovascular:     Rate and Rhythm: Normal rate and regular rhythm.     Heart sounds: Normal heart sounds.  Pulmonary:     Effort: Pulmonary effort is normal.     Breath sounds: Normal breath sounds.  Abdominal:     Palpations: Abdomen is soft.  Lymphadenopathy:     Cervical: No cervical adenopathy.  Skin:    General: Skin is warm and dry.  Neurological:     General: No focal deficit present.     Mental Status: She is alert and oriented to person, place, and time.  Psychiatric:        Mood and Affect: Mood normal.        Behavior: Behavior normal.        Thought Content: Thought content normal.        Judgment: Judgment normal.       Results for orders placed or performed in visit on 02/04/21  POCT glycosylated hemoglobin (Hb A1C)  Result Value Ref Range   Hemoglobin A1C 7.7 (A) 4.0 - 5.6 %   HbA1c POC (<>  result, manual entry)     HbA1c, POC (prediabetic range)     HbA1c, POC (controlled diabetic range)      Assessment & Plan     1. Type 2 diabetes mellitus without complication, without long-term current use of insulin (HCC) A1c is 7.7.  Goal less than 7.  Patient ready to work on diet and exercise and check sugar more regularly. - POCT glycosylated hemoglobin (Hb A1C)  2. Essential hypertension Good blood pressure control on losartan  25. - Comprehensive metabolic panel  3. Pure hypercholesterolemia Controlled on rosuvastatin 10.  LDL goal less than 70 - Lipid panel   No follow-ups on file.      I, [name], MD, have reviewed all documentation for this visit. The documentation on 02/10/21 for the exam, diagnosis, procedures, and orders are all accurate and complete.    Dorri Ozturk Wendelyn Breslow, MD  [facility] [phone] (phone) [phone] (fax)  [facility]

## 2021-02-05 ENCOUNTER — Other Ambulatory Visit: Payer: Self-pay | Admitting: Family Medicine

## 2021-02-05 ENCOUNTER — Other Ambulatory Visit: Payer: Self-pay | Admitting: *Deleted

## 2021-02-05 DIAGNOSIS — Z1231 Encounter for screening mammogram for malignant neoplasm of breast: Secondary | ICD-10-CM

## 2021-02-05 DIAGNOSIS — E7800 Pure hypercholesterolemia, unspecified: Secondary | ICD-10-CM

## 2021-02-05 DIAGNOSIS — G8929 Other chronic pain: Secondary | ICD-10-CM

## 2021-02-05 DIAGNOSIS — R109 Unspecified abdominal pain: Secondary | ICD-10-CM

## 2021-02-05 DIAGNOSIS — K5904 Chronic idiopathic constipation: Secondary | ICD-10-CM

## 2021-02-05 LAB — COMPREHENSIVE METABOLIC PANEL
ALT: 9 IU/L (ref 0–32)
AST: 9 IU/L (ref 0–40)
Albumin/Globulin Ratio: 1.8 (ref 1.2–2.2)
Albumin: 4.6 g/dL (ref 3.8–4.9)
Alkaline Phosphatase: 76 IU/L (ref 44–121)
BUN/Creatinine Ratio: 12 (ref 9–23)
BUN: 11 mg/dL (ref 6–24)
Bilirubin Total: <0.2 mg/dL (ref 0.0–1.2)
CO2: 21 mmol/L (ref 20–29)
Calcium: 9.1 mg/dL (ref 8.7–10.2)
Chloride: 107 mmol/L — ABNORMAL HIGH (ref 96–106)
Creatinine, Ser: 0.95 mg/dL (ref 0.57–1.00)
Globulin, Total: 2.5 g/dL (ref 1.5–4.5)
Glucose: 144 mg/dL — ABNORMAL HIGH (ref 65–99)
Potassium: 4.6 mmol/L (ref 3.5–5.2)
Sodium: 144 mmol/L (ref 134–144)
Total Protein: 7.1 g/dL (ref 6.0–8.5)
eGFR: 71 mL/min/{1.73_m2} (ref >59)

## 2021-02-05 LAB — LIPID PANEL
Chol/HDL Ratio: 3.6 ratio (ref 0.0–4.4)
Cholesterol, Total: 218 mg/dL — ABNORMAL HIGH (ref 100–199)
HDL: 60 mg/dL (ref >39)
LDL Chol Calc (NIH): 131 mg/dL — ABNORMAL HIGH (ref 0–99)
Triglycerides: 152 mg/dL — ABNORMAL HIGH (ref 0–149)
VLDL Cholesterol Cal: 27 mg/dL (ref 5–40)

## 2021-02-05 MED ORDER — ROSUVASTATIN CALCIUM 40 MG PO TABS: 40.0000 mg | ORAL_TABLET | Freq: Every day | ORAL | 3 refills | Status: DC

## 2021-02-06 ENCOUNTER — Encounter: Payer: Self-pay | Admitting: *Deleted

## 2021-03-03 ENCOUNTER — Telehealth: Payer: No Typology Code available for payment source | Admitting: Family

## 2021-03-03 DIAGNOSIS — J069 Acute upper respiratory infection, unspecified: Secondary | ICD-10-CM | POA: Diagnosis not present

## 2021-03-03 MED ORDER — BENZONATATE 100 MG PO CAPS: 100.0000 mg | ORAL_CAPSULE | Freq: Three times a day (TID) | ORAL | 0 refills | Status: DC | PRN

## 2021-03-03 MED ORDER — FLUTICASONE PROPIONATE 50 MCG/ACT NA SUSP: 2.0000 | Freq: Every day | NASAL | 6 refills | Status: DC

## 2021-03-03 NOTE — Progress Notes (Signed)

## 2021-03-21 ENCOUNTER — Telehealth: Payer: Medicaid Other | Admitting: Nurse Practitioner

## 2021-03-21 DIAGNOSIS — J069 Acute upper respiratory infection, unspecified: Secondary | ICD-10-CM

## 2021-03-21 NOTE — Progress Notes (Signed)
We are sorry that you are not feeling well.  Here is how we plan to help!  Based on your presentation I believe you most likely have A cough due to a virus.  This is called viral bronchitis and is best treated by rest, plenty of fluids and control of the cough.  You may use Ibuprofen or Tylenol as directed to help your symptoms.     In addition you may use A prescription cough medication called Tessalon Perles 100mg . You may take 1-2 capsules every 8 hours as needed for your cough.   After careful review of your answers, I would not recommend an antibiotic for your condition.  Antibiotics are not effective against viruses and therefore should not be used to treat them. Common examples of infections caused by viruses include colds and flu   I cannot give you prednisone because you have not had your hgba1c checked since 2021.  From your responses in the eVisit questionnaire you describe inflammation in the upper respiratory tract which is causing a significant cough.  This is commonly called Bronchitis and has four common causes:    Allergies  Viral Infections  Acid Reflux  Bacterial Infection Allergies, viruses and acid reflux are treated by controlling symptoms or eliminating the cause. An example might be a cough caused by taking certain blood pressure medications. You stop the cough by changing the medication. Another example might be a cough caused by acid reflux. Controlling the reflux helps control the cough.  USE OF BRONCHODILATOR ("RESCUE") INHALERS: There is a risk from using your bronchodilator too frequently.  The risk is that over-reliance on a medication which only relaxes the muscles surrounding the breathing tubes can reduce the effectiveness of medications prescribed to reduce swelling and congestion of the tubes themselves.  Although you feel brief relief from the bronchodilator inhaler, your asthma may actually be worsening with the tubes becoming more swollen and filled with  mucus.  This can delay other crucial treatments, such as oral steroid medications. If you need to use a bronchodilator inhaler daily, several times per day, you should discuss this with your provider.  There are probably better treatments that could be used to keep your asthma under control.     HOME CARE . Only take medications as instructed by your medical team. . Complete the entire course of an antibiotic. . Drink plenty of fluids and get plenty of rest. . Avoid close contacts especially the very young and the elderly . Cover your mouth if you cough or cough into your sleeve. . Always remember to wash your hands . A steam or ultrasonic humidifier can help congestion.   GET HELP RIGHT AWAY IF: . You develop worsening fever. . You become short of breath . You cough up blood. . Your symptoms persist after you have completed your treatment plan MAKE SURE YOU   Understand these instructions.  Will watch your condition.  Will get help right away if you are not doing well or get worse.  Your e-visit answers were reviewed by a board certified advanced clinical practitioner to complete your personal care plan.  Depending on the condition, your plan could have included both over the counter or prescription medications. If there is a problem please reply  once you have received a response from your provider. Your safety is important to 2022.  If you have drug allergies check your prescription carefully.    You can use MyChart to ask questions about today's visit, request a  non-urgent call back, or ask for a work or school excuse for 24 hours related to this e-Visit. If it has been greater than 24 hours you will need to follow up with your provider, or enter a new e-Visit to address those concerns. You will get an e-mail in the next two days asking about your experience.  I hope that your e-visit has been valuable and will speed your recovery. Thank you for using e-visits.  5-10 minutes spent  reviewing and documenting in chart.

## 2021-03-22 ENCOUNTER — Encounter: Payer: Self-pay | Admitting: Nurse Practitioner

## 2021-03-22 ENCOUNTER — Telehealth: Payer: Medicaid Other | Admitting: Nurse Practitioner

## 2021-03-22 DIAGNOSIS — J40 Bronchitis, not specified as acute or chronic: Secondary | ICD-10-CM

## 2021-03-22 MED ORDER — DOXYCYCLINE HYCLATE 100 MG PO TABS: 100.0000 mg | ORAL_TABLET | Freq: Two times a day (BID) | ORAL | 0 refills | Status: DC

## 2021-03-22 NOTE — Patient Instructions (Signed)

## 2021-03-22 NOTE — Progress Notes (Signed)
[name], Jacqueline Romero are scheduled for a virtual visit with your provider today.    Just as we do with appointments in the office, we must obtain your consent to participate.  Your consent will be active for this visit and any virtual visit you may have with one of our providers in the next 365 days.    If you have a MyChart account, I can also send a copy of this consent to you electronically.  All virtual visits are billed to your insurance company just like a traditional visit in the office.  As this is a virtual visit, video technology does not allow for your provider to perform a traditional examination.  This may limit your provider's ability to fully assess your condition.  If your provider identifies any concerns that need to be evaluated in person or the need to arrange testing such as labs, EKG, etc, we will make arrangements to do so.    Although advances in technology are sophisticated, we cannot ensure that it will always work on either your end or our end.  If the connection with a video visit is poor, we may have to switch to a telephone visit.  With either a video or telephone visit, we are not always able to ensure that we have a secure connection.   I need to obtain your verbal consent now.   Are you willing to proceed with your visit today?   [name] has provided verbal consent on 03/22/2021 for a virtual visit (video or telephone).  Virtual Visit via Video  I connected with  [name]  on 03/22/21 at 9:50 by video and verified that I am speaking with the correct person using two identifiers. Jacqueline Romero is currently located at home and no  one is currently with her during visit. The provider, Mary-Margaret Daphine Deutscher, FNP is located at home at time of visit.  I discussed the limitations, risks, security and privacy concerns of performing an evaluation and management service by video  and the availability of in person appointments. I also discussed with the patient  that there may be a patient responsible charge related to this service. The patient expressed understanding and agreed to proceed.   Subjective:   HPI:   Has had  Cough and congestion for almost 2 weeks. Was treated in evisit last week with tessalon perles and flonase nasal spray. Cough has gone deep into chest and she is no better. Test negative for covid 2x last week.  Review of Systems  Constitutional: Negative for chills and fever.  HENT: Positive for congestion. Negative for sinus pain and sore throat.   Respiratory: Positive for cough and sputum production.   Musculoskeletal: Negative for myalgias.  Neurological: Negative for headaches.     See pertinent positives and negatives per HPI.  Patient Active Problem List   Diagnosis Date Noted  . Family history of pancreatic cancer 12/21/2019  . Family history of breast cancer 12/21/2019  . Constipation 07/17/2018  . Anemia   . Hypertension   . Epigastric abdominal pain 05/10/2017  . Vitamin D deficiency 06/19/2015  . Obesity 06/19/2015  . Hyperlipidemia 06/19/2015  . GERD (gastroesophageal reflux disease) 06/19/2015  . Diabetes mellitus, type 2 (HCC) 03/25/2015    Social History   Tobacco Use  . Smoking status: Never Smoker  . Smokeless tobacco: Never Used  Substance Use Topics  . Alcohol use: No    Current Outpatient Medications:  .  benzonatate (TESSALON PERLES) 100  MG capsule, Take 1 capsule (100 mg total) by mouth 3 (three) times daily as needed., Disp: 20 capsule, Rfl: 0 .  Cholecalciferol (VITAMIN D) 2000 UNITS CAPS, Take 2,000 Units by mouth daily., Disp: , Rfl:  .  clotrimazole-betamethasone (LOTRISONE) cream, Apply externally BID prn sx up to 2 wks, Disp: 45 g, Rfl: 0 .  ferrous fumarate (HEMOCYTE - 106 MG FE) 325 (106 FE) MG TABS tablet, Take 1 tablet by mouth daily. , Disp: , Rfl:  .  fluticasone (FLONASE) 50 MCG/ACT nasal spray, Place 2 sprays into both nostrils daily., Disp: 16 g, Rfl: 6 .   hydrocortisone 2.5 % cream, Apply 1 application topically at bedtime., Disp: 30 g, Rfl: 0 .  losartan (COZAAR) 25 MG tablet, Take 1 tablet (25 mg total) by mouth daily., Disp: 90 tablet, Rfl: 3 .  metFORMIN (GLUCOPHAGE) 1000 MG tablet, Take 1 tablet (1,000 mg total) by mouth 2 (two) times daily., Disp: 180 tablet, Rfl: 3 .  Multiple Vitamins-Minerals (WOMENS MULTIVITAMIN PLUS PO), Take 1 tablet by mouth daily., Disp: , Rfl:  .  rosuvastatin (CRESTOR) 40 MG tablet, Take 1 tablet (40 mg total) by mouth daily., Disp: 90 tablet, Rfl: 3  Allergies  Allergen Reactions  . Elemental Sulfur Hives  . Penicillins Hives and Swelling    Objective:   There were no vitals taken for this visit.  Patient is well-developed, well-nourished in no acute distress.  Resting comfortably  at home.  Head is normocephalic, atraumatic.  No labored breathing.  Speech is clear and coherent with logical content.  Patient is alert and oriented at baseline.  Deep dry cough  Assessment and Plan:        [name] in today with chief complaint of No chief complaint on file.   1. Bronchitis 1. Take meds as prescribed 2. Use a cool mist humidifier especially during the winter months and when heat has been humid. 3. Use saline nose sprays frequently 4. Saline irrigations of the nose can be very helpful if done frequently.  * 4X daily for 1 week*  * Use of a nettie pot can be helpful with this. Follow directions with this* 5. Drink plenty of fluids 6. Keep thermostat turn down low 7.For any cough or congestion  Use plain Mucinex- regular strength or max strength is fine   * Children- consult with Pharmacist for dosing 8. For fever or aces or pains- take tylenol or ibuprofen appropriate for age and weight.  * for fevers greater than 101 orally you may alternate ibuprofen and tylenol every  3 hours.   Meds ordered this encounter  Medications  . doxycycline (VIBRA-TABS) 100 MG tablet    Sig: Take 1  tablet (100 mg total) by mouth 2 (two) times daily. 1 po bid    Dispense:  20 tablet    Refill:  0    Order Specific Question:   Supervising Provider    Answer:   Eber Hong [[id number]]       The above assessment and management plan was discussed with the patient. The patient verbalized understanding of and has agreed to the management plan. Patient is aware to call the clinic if symptoms persist or worsen. Patient is aware when to return to the clinic for a follow-up visit. Patient educated on when it is appropriate to go to the emergency department.   Mary-Margaret Daphine Deutscher, FNP  .   Mary-Margaret Daphine Deutscher, FNP 03/22/2021  Time spent with the patient: 10 minutes, of  which >50% was spent in obtaining information about symptoms, reviewing previous labs, evaluations, and treatments, counseling about condition (please see the discussed topics above), and developing a plan to further investigate it; had a number of questions which I addressed.

## 2021-03-31 ENCOUNTER — Ambulatory Visit: Payer: Self-pay | Admitting: Family Medicine

## 2021-05-14 ENCOUNTER — Ambulatory Visit: Payer: Self-pay | Admitting: Family Medicine

## 2021-05-15 NOTE — Progress Notes (Signed)
PCP: [name]., MD   Chief Complaint  Patient presents with   Gynecologic Exam    No concerns    HPI:      [name] is a 55 y.o. G1P1001 whose LMP was No LMP recorded. Patient has had a hysterectomy., presents today for her annual examination.  Her menses are absent due to [facility] in 2009 for AUB and fibroids. No PMB. She does not have vasomotor sx.   Sex activity: single partner, contraception - status post hysterectomy. She does not have vaginal dryness.  Last Pap: 05/15/20 Results were: no abnormalities /neg HPV DNA 2019.  Hx of STDs: none  Last mammogram: 06/04/20 Results were: normal--routine follow-up in 12 months, has appt 8/22.  There is a FH of breast cancer in her sister and pancreatic cancer in her mother. There is no FH of ovarian cancer. Pt is MyRisk neg 2021 except 4 VUS, IBIS=16.1% The patient does do self-breast exams.  Colonoscopy: has appt 8/22 with Dr. Bary Castilla; previous one 2016 with diverticulitis  Tobacco use: The patient denies current or previous tobacco use. Alcohol use: none Exercise: moderately active  She does get adequate calcium and Vitamin D in her diet.  Labs with PCP.   Past Medical History:  Diagnosis Date   Anemia    BRCA negative 12/2019   MyRisk neg except CDKN2A and 3-MSH3 VUS; IBIS=16.1%   Chronic kidney disease    Diabetes mellitus without complication ([place])    Diverticulosis    Duodenitis    Family history of pancreatic cancer    Heart murmur    Hypertension    Migraine     Past Surgical History:  Procedure Laterality Date   CHOLECYSTECTOMY     laparscopic-Dr Byrnett   COLONOSCOPY WITH PROPOFOL N/A 04/15/2015   Procedure: COLONOSCOPY WITH PROPOFOL;  Surgeon: Lollie Sails, MD;  Location: [facility] ENDOSCOPY;  Service: Endoscopy;  Laterality: N/A;   ESOPHAGOGASTRODUODENOSCOPY N/A 04/15/2015   Procedure: ESOPHAGOGASTRODUODENOSCOPY (EGD);  Surgeon: Lollie Sails, MD;  Location: [facility] ENDOSCOPY;   Service: Endoscopy;  Laterality: N/A;   KNEE ARTHROSCOPY W/ SYNOVECTOMY Left    LAPAROSCOPIC SUPRACERVICAL HYSTERECTOMY  2009   fibroids/AUB   TUBAL LIGATION Bilateral     Family History  Problem Relation Age of Onset   Pancreatic cancer Mother 46   Throat cancer Father    Hypertension Father    Hypertension Sister    Diabetes Sister    Breast cancer Sister 74   Hypertension Brother    Diabetes Maternal Aunt    Hypertension Maternal Aunt    Hypertension Maternal Uncle    Diabetes Maternal Uncle     Social History   Socioeconomic History   Marital status: Married    Spouse name: Quillian Quince   Number of children: 1   Years of education: masters   Highest education level: Not on file  Occupational History   Occupation: Primary school teacher: [employer]  Tobacco Use   Smoking status: Never   Smokeless tobacco: Never  Vaping Use   Vaping Use: Never used  Substance and Sexual Activity   Alcohol use: No   Drug use: No   Sexual activity: Yes    Partners: Male    Birth control/protection: Surgical, Post-menopausal    Comment: Hysterectomy   Other Topics Concern   Not on file  Social History Narrative   Has one granddaughter named " [name]" 9 years old (born 2016)   Social  Determinants of Health   Financial Resource Strain: Not on file  Food Insecurity: Not on file  Transportation Needs: Not on file  Physical Activity: Not on file  Stress: Not on file  Social Connections: Not on file  Intimate Partner Violence: Not on file     Current Outpatient Medications:    Cholecalciferol (VITAMIN D) 2000 UNITS CAPS, Take 2,000 Units by mouth daily., Disp: , Rfl:    ferrous fumarate (HEMOCYTE - 106 MG FE) 325 (106 FE) MG TABS tablet, Take 1 tablet by mouth daily. , Disp: , Rfl:    fluticasone (FLONASE) 50 MCG/ACT nasal spray, Place 2 sprays into both nostrils daily., Disp: 16 g, Rfl: 6   hydrocortisone 2.5 % cream, Apply 1 application topically at bedtime., Disp: 30  g, Rfl: 0   losartan (COZAAR) 25 MG tablet, Take 1 tablet (25 mg total) by mouth daily., Disp: 90 tablet, Rfl: 3   metFORMIN (GLUCOPHAGE) 1000 MG tablet, Take 1 tablet (1,000 mg total) by mouth 2 (two) times daily., Disp: 180 tablet, Rfl: 3   Multiple Vitamins-Minerals (WOMENS MULTIVITAMIN PLUS PO), Take 1 tablet by mouth daily., Disp: , Rfl:    nortriptyline (PAMELOR) 10 MG capsule, Take 10 mg by mouth at bedtime., Disp: , Rfl:    rosuvastatin (CRESTOR) 40 MG tablet, Take 1 tablet (40 mg total) by mouth daily., Disp: 90 tablet, Rfl: 3     ROS:  Review of Systems  Constitutional:  Negative for fatigue, fever and unexpected weight change.  Respiratory:  Negative for cough, shortness of breath and wheezing.   Cardiovascular:  Negative for chest pain, palpitations and leg swelling.  Gastrointestinal:  Negative for blood in stool, constipation, diarrhea, nausea and vomiting.  Endocrine: Negative for cold intolerance, heat intolerance and polyuria.  Genitourinary:  Negative for dyspareunia, dysuria, flank pain, frequency, genital sores, hematuria, menstrual problem, pelvic pain, urgency, vaginal bleeding, vaginal discharge and vaginal pain.  Musculoskeletal:  Negative for back pain, joint swelling and myalgias.  Skin:  Negative for rash.  Neurological:  Negative for dizziness, syncope, light-headedness, numbness and headaches.  Hematological:  Negative for adenopathy.  Psychiatric/Behavioral:  Negative for agitation, confusion, sleep disturbance and suicidal ideas. The patient is not nervous/anxious.   BREAST: No symptoms    Objective: BP 138/84   Ht 5' 6"  (1.676 m)   Wt 180 lb (81.6 kg)   BMI 29.05 kg/m    Physical Exam Constitutional:      Appearance: She is well-developed.  Genitourinary:     Vulva normal.     Right Labia: No rash, tenderness or lesions.    Left Labia: No tenderness, lesions or rash.    No vaginal discharge, erythema or tenderness.      Right Adnexa: not  tender and no mass present.    Left Adnexa: not tender and no mass present.    No cervical friability or polyp.     Uterus is not tender.     Uterus is absent.  Breasts:    Right: No mass, nipple discharge, skin change or tenderness.     Left: No mass, nipple discharge, skin change or tenderness.  Neck:     Thyroid: No thyromegaly.  Cardiovascular:     Rate and Rhythm: Normal rate and regular rhythm.     Heart sounds: Normal heart sounds. No murmur heard. Pulmonary:     Effort: Pulmonary effort is normal.     Breath sounds: Normal breath sounds.  Abdominal:     Palpations:  Abdomen is soft.     Tenderness: There is no abdominal tenderness. There is no guarding or rebound.  Musculoskeletal:        General: Normal range of motion.     Cervical back: Normal range of motion.  Lymphadenopathy:     Cervical: No cervical adenopathy.  Neurological:     General: No focal deficit present.     Mental Status: She is alert and oriented to person, place, and time.     Cranial Nerves: No cranial nerve deficit.  Skin:    General: Skin is warm and dry.  Psychiatric:        Mood and Affect: Mood normal.        Behavior: Behavior normal.        Thought Content: Thought content normal.        Judgment: Judgment normal.  Vitals reviewed.    Assessment/Plan:  Encounter for annual routine gynecological examination  Encounter for screening mammogram for malignant neoplasm of breast; pt has mammo sched  Family history of breast cancer--Pt is MyRisk neg.           GYN counsel breast self exam, mammography screening, menopause, adequate intake of calcium and vitamin D, diet and exercise    F/U  Return in about 1 year (around 05/19/2022).  Donnamarie Shankles B. Tekeyah Santiago, PA-C 05/19/2021 10:40 AM

## 2021-05-19 ENCOUNTER — Other Ambulatory Visit: Payer: Self-pay

## 2021-05-19 ENCOUNTER — Ambulatory Visit (INDEPENDENT_AMBULATORY_CARE_PROVIDER_SITE_OTHER): Payer: BC Managed Care – PPO | Admitting: Obstetrics and Gynecology

## 2021-05-19 ENCOUNTER — Encounter: Payer: Self-pay | Admitting: Obstetrics and Gynecology

## 2021-05-19 VITALS — BP 138/84 | Ht 66.0 in | Wt 180.0 lb

## 2021-05-19 DIAGNOSIS — Z1231 Encounter for screening mammogram for malignant neoplasm of breast: Secondary | ICD-10-CM | POA: Diagnosis not present

## 2021-05-19 DIAGNOSIS — Z01419 Encounter for gynecological examination (general) (routine) without abnormal findings: Secondary | ICD-10-CM | POA: Diagnosis not present

## 2021-05-19 DIAGNOSIS — Z803 Family history of malignant neoplasm of breast: Secondary | ICD-10-CM

## 2021-05-19 NOTE — Patient Instructions (Signed)
I value your feedback and you entrusting us with your care. If you get a [facility] patient survey, I would appreciate you taking the time to let us know about your experience today. Thank you! ? ? ?

## 2021-06-04 ENCOUNTER — Ambulatory Visit (INDEPENDENT_AMBULATORY_CARE_PROVIDER_SITE_OTHER): Payer: BC Managed Care – PPO | Admitting: Family Medicine

## 2021-06-04 ENCOUNTER — Other Ambulatory Visit: Payer: Self-pay

## 2021-06-04 VITALS — BP 116/73 | HR 95 | Resp 16 | Ht 66.0 in | Wt 183.0 lb

## 2021-06-04 DIAGNOSIS — I10 Essential (primary) hypertension: Secondary | ICD-10-CM

## 2021-06-04 DIAGNOSIS — K5904 Chronic idiopathic constipation: Secondary | ICD-10-CM | POA: Diagnosis not present

## 2021-06-04 DIAGNOSIS — E119 Type 2 diabetes mellitus without complications: Secondary | ICD-10-CM | POA: Diagnosis not present

## 2021-06-04 DIAGNOSIS — E7800 Pure hypercholesterolemia, unspecified: Secondary | ICD-10-CM

## 2021-06-04 NOTE — Progress Notes (Signed)
I,[name],acting as a scribe for [name], MD.,have documented all relevant documentation on the behalf of [name], MD,as directed by  [name], MD while in the presence of [name], MD.   Established patient visit   Patient: Jacqueline Romero   DOB: 08/27/1966   55 y.o. 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  She feels well and has no complaints. Diabetes Mellitus Type II, follow-up  Lab Results  Component Value Date   HGBA1C 7.7 (A) 02/04/2021   HGBA1C 7.9 (H) 10/01/2020   HGBA1C 7.5 (A) 05/16/2020   Last seen for diabetes 4 months ago.  Management since then includes continuing the same treatment. She reports good compliance with treatment. She is not having side effects. none  Home blood sugar records: fasting range: not checking  Episodes of hypoglycemia? No none   Current insulin regiment: n/a Most Recent Eye Exam: 08/28/2020  ---------------------------------------------------------------------------------------------------  Hypertension, follow-up  BP Readings from Last 3 Encounters:  06/04/21 116/73  05/19/21 138/84  02/04/21 128/80   Wt Readings from Last 3 Encounters:  06/04/21 183 lb (83 kg)  05/19/21 180 lb (81.6 kg)  02/04/21 181 lb (82.1 kg)     She was last seen for hypertension 4 months ago.  BP at that visit was 128/80. Management since that visit includes; Good blood pressure control on losartan 25. She reports good compliance with treatment. She is not having side effects. none She is exercising. She is adherent to low salt diet.   Outside blood pressures  are not checking.  She does not smoke.  Use of agents associated with hypertension: none.   --------------------------------------------------------------------------------------------------- Lipid/Cholesterol, follow-up  Last Lipid Panel: Lab Results  Component Value Date   CHOL 218 (H) 02/04/2021   LDLCALC 131 (H) 02/04/2021   HDL 60 02/04/2021   TRIG 152 (H) 02/04/2021    She was last seen for this 4 months ago.  Management since that visit includes; labs checked showing-okay but cholesterol high for diabetic. Increased rosuvastatin from 10 to 40 mg daily.  We will follow-up on her next visit.  She reports good compliance with treatment. She is not having side effects. none  She is following a Regular diet. Current exercise: walking  Last metabolic panel Lab Results  Component Value Date   GLUCOSE 144 (H) 02/04/2021   NA 144 02/04/2021   K 4.6 02/04/2021   BUN 11 02/04/2021   CREATININE 0.95 02/04/2021   GFRNONAA 75 10/01/2020   GFRAA 87 10/01/2020   CALCIUM 9.1 02/04/2021   AST 9 02/04/2021   ALT 9 02/04/2021   The 10-year ASCVD risk score [name], et al., 2013) is: 8.2%  ---------------------------------------------------------------------------------------------------       Medications: Outpatient Medications Prior to Visit  Medication Sig   Cholecalciferol (VITAMIN D) 2000 UNITS CAPS Take 2,000 Units by mouth daily.   ferrous fumarate (HEMOCYTE - 106 MG FE) 325 (106 FE) MG TABS tablet Take 1 tablet by mouth daily.    fluticasone (FLONASE) 50 MCG/ACT nasal spray Place 2 sprays into both nostrils daily.   hydrocortisone 2.5 % cream Apply 1 application topically at bedtime.   losartan (COZAAR) 25 MG tablet Take 1 tablet (25 mg total)  by mouth daily.   metFORMIN (GLUCOPHAGE) 1000 MG tablet Take 1 tablet (1,000 mg total) by mouth 2 (two) times daily.   Multiple Vitamins-Minerals (WOMENS MULTIVITAMIN PLUS PO) Take 1 tablet by mouth daily.    nortriptyline (PAMELOR) 10 MG capsule Take 10 mg by mouth at bedtime.   rosuvastatin (CRESTOR) 40 MG tablet Take 1 tablet (40 mg total) by mouth daily.   No facility-administered medications prior to visit.    Review of Systems  Constitutional:  Negative for appetite change, chills, fatigue and fever.  Respiratory:  Negative for chest tightness and shortness of breath.   Cardiovascular:  Negative for chest pain and palpitations.  Gastrointestinal:  Negative for abdominal pain, nausea and vomiting.  Neurological:  Negative for dizziness and weakness.       Objective  -------------------------------------------------------------------------------------------------------------------- BP 116/73 (BP Location: Right Arm, Patient Position: Sitting, Cuff Size: Large)   Pulse 95   Resp 16   Ht 5\' 6"  (1.676 m)   Wt 183 lb (83 kg)   SpO2 98%   BMI 29.54 kg/m  BP Readings from Last 3 Encounters:  06/13/21 (!) 147/89  06/04/21 116/73  05/19/21 138/84   Wt Readings from Last 3 Encounters:  06/13/21 178 lb (80.7 kg)  06/04/21 183 lb (83 kg)  05/19/21 180 lb (81.6 kg)       Physical Exam Vitals reviewed.  Constitutional:      Appearance: She is well-developed.  HENT:     Head: Normocephalic.     Right Ear: External ear normal.     Left Ear: External ear normal.     Nose: Nose normal.  Eyes:     General: No scleral icterus.    Conjunctiva/sclera: Conjunctivae normal.  Neck:     Thyroid: No thyromegaly.  Cardiovascular:     Rate and Rhythm: Normal rate and regular rhythm.     Heart sounds: Normal heart sounds.  Pulmonary:     Effort: Pulmonary effort is normal.     Breath sounds: Normal breath sounds.  Abdominal:     Palpations: Abdomen is soft.  Lymphadenopathy:     Cervical: No cervical adenopathy.  Skin:    General: Skin is warm and dry.  Neurological:     General: No focal deficit present.     Mental Status: She is alert and oriented to person, place, and time.      Comments: Normal diabetic monofilament exam of feet.  Psychiatric:        Mood and Affect: Mood normal.        Behavior: Behavior normal.        Thought Content: Thought content normal.        Judgment: Judgment normal.      No results found for any visits on 06/04/21.  Assessment & Plan  ---------------------------------------------------------------------------------------------------------------------- 1. Type 2 diabetes mellitus without complication, without long-term current use of insulin (HCC) Will A1c less than 7. - Hemoglobin A1c  2. Essential hypertension Blood pressure well controlled on losartan 25 mg daily - Comprehensive metabolic panel  3. Pure hypercholesterolemia Crestor 40.  Check lipid panel. - Lipid panel 4.  Chronic constipation  No follow-ups on file.      I, 06/06/21, MD, have reviewed all documentation for this visit. The documentation on 06/13/21 for the exam, diagnosis, procedures, and orders are all accurate and complete.    Kross Swallows 06/15/21, MD  [facility] [phone] (phone) [phone] (fax)  [facility]

## 2021-06-06 ENCOUNTER — Ambulatory Visit
Admission: RE | Admit: 2021-06-06 | Discharge: 2021-06-06 | Disposition: A | Discharge status: Home / Self Care | Payer: BC Managed Care – PPO | Source: Ambulatory Visit | Attending: Family Medicine | Admitting: Family Medicine

## 2021-06-06 ENCOUNTER — Other Ambulatory Visit: Payer: Self-pay

## 2021-06-06 DIAGNOSIS — Z1231 Encounter for screening mammogram for malignant neoplasm of breast: Secondary | ICD-10-CM | POA: Insufficient documentation

## 2021-06-13 ENCOUNTER — Ambulatory Visit: Payer: BC Managed Care – PPO | Admitting: Anesthesiology

## 2021-06-13 ENCOUNTER — Ambulatory Visit
Admission: RE | Admit: 2021-06-13 | Discharge: 2021-06-13 | Disposition: A | Discharge status: Home / Self Care | Payer: BC Managed Care – PPO | Attending: General Surgery | Admitting: General Surgery

## 2021-06-13 ENCOUNTER — Encounter
Admission: RE | Disposition: A | Discharge status: Home / Self Care | Payer: Self-pay | Source: Home / Self Care | Attending: General Surgery

## 2021-06-13 DIAGNOSIS — Z880 Allergy status to penicillin: Secondary | ICD-10-CM | POA: Insufficient documentation

## 2021-06-13 DIAGNOSIS — Z791 Long term (current) use of non-steroidal anti-inflammatories (NSAID): Secondary | ICD-10-CM | POA: Diagnosis not present

## 2021-06-13 DIAGNOSIS — N189 Chronic kidney disease, unspecified: Secondary | ICD-10-CM | POA: Insufficient documentation

## 2021-06-13 DIAGNOSIS — R1084 Generalized abdominal pain: Secondary | ICD-10-CM | POA: Diagnosis not present

## 2021-06-13 DIAGNOSIS — E1122 Type 2 diabetes mellitus with diabetic chronic kidney disease: Secondary | ICD-10-CM | POA: Diagnosis not present

## 2021-06-13 DIAGNOSIS — Z7984 Long term (current) use of oral hypoglycemic drugs: Secondary | ICD-10-CM | POA: Diagnosis not present

## 2021-06-13 DIAGNOSIS — Z800 Family history of malignant neoplasm of digestive organs: Secondary | ICD-10-CM | POA: Diagnosis not present

## 2021-06-13 DIAGNOSIS — Z79899 Other long term (current) drug therapy: Secondary | ICD-10-CM | POA: Diagnosis not present

## 2021-06-13 HISTORY — PX: COLONOSCOPY WITH PROPOFOL: SHX5780

## 2021-06-13 LAB — GLUCOSE, CAPILLARY: Glucose-Capillary: 146 mg/dL — ABNORMAL HIGH (ref 70–99)

## 2021-06-13 SURGERY — COLONOSCOPY WITH PROPOFOL
Anesthesia: General

## 2021-06-13 MED ORDER — PROPOFOL 500 MG/50ML IV EMUL
INTRAVENOUS | Status: AC
  Filled 2021-06-13: qty 250

## 2021-06-13 MED ORDER — PHENYLEPHRINE HCL (PRESSORS) 10 MG/ML IV SOLN
INTRAVENOUS | Status: AC
  Filled 2021-06-13: qty 1

## 2021-06-13 MED ORDER — LIDOCAINE HCL (CARDIAC) PF 100 MG/5ML IV SOSY
PREFILLED_SYRINGE | INTRAVENOUS | Status: DC | PRN
  Administered 2021-06-13: 40 mg via INTRAVENOUS

## 2021-06-13 MED ORDER — SODIUM CHLORIDE 0.9 % IV SOLN
INTRAVENOUS | Status: DC
  Administered 2021-06-13: 20 mL/h via INTRAVENOUS

## 2021-06-13 MED ORDER — PROPOFOL 500 MG/50ML IV EMUL
INTRAVENOUS | Status: DC | PRN
  Administered 2021-06-13: 125 ug/kg/min via INTRAVENOUS

## 2021-06-13 MED ORDER — PROPOFOL 10 MG/ML IV BOLUS
INTRAVENOUS | Status: DC | PRN
  Administered 2021-06-13: 30 mg via INTRAVENOUS
  Administered 2021-06-13: 50 mg via INTRAVENOUS

## 2021-06-13 MED ORDER — PROPOFOL 500 MG/50ML IV EMUL
INTRAVENOUS | Status: AC
  Filled 2021-06-13: qty 50

## 2021-06-13 NOTE — H&P (Signed)
Miana Politte [place] [id number] Dec 01, 1958     HPI:  Decades long history of abdominal pain, worse after meals. Worse after [employer] and post cholecystectomy 10 years+ ago.  Long history of constipation with incomplete emptying.   Medications Prior to Admission  Medication Sig Dispense Refill Last Dose   Cholecalciferol (VITAMIN D) 2000 UNITS CAPS Take 2,000 Units by mouth daily.   Past Week   ferrous fumarate (HEMOCYTE - 106 MG FE) 325 (106 FE) MG TABS tablet Take 1 tablet by mouth daily.    Past Week   fluticasone (FLONASE) 50 MCG/ACT nasal spray Place 2 sprays into both nostrils daily. 16 g 6 Past Week   losartan (COZAAR) 25 MG tablet Take 1 tablet (25 mg total) by mouth daily. 90 tablet 3 Past Week   meloxicam (MOBIC) 15 MG tablet Take 15 mg by mouth daily.   Past Week   metFORMIN (GLUCOPHAGE) 1000 MG tablet Take 1 tablet (1,000 mg total) by mouth 2 (two) times daily. 180 tablet 3 Past Week   Multiple Vitamins-Minerals (WOMENS MULTIVITAMIN PLUS PO) Take 1 tablet by mouth daily.   Past Week   nortriptyline (PAMELOR) 10 MG capsule Take 10 mg by mouth at bedtime.   Past Week   pantoprazole (PROTONIX) 40 MG tablet Take 40 mg by mouth daily.   Past Week   pravastatin (PRAVACHOL) 10 MG tablet Take 10 mg by mouth daily.   Past Week   rosuvastatin (CRESTOR) 40 MG tablet Take 1 tablet (40 mg total) by mouth daily. 90 tablet 3 Past Week   hydrocortisone 2.5 % cream Apply 1 application topically at bedtime. 30 g 0    Allergies  Allergen Reactions   Elemental Sulfur Hives   Penicillins Hives and Swelling   Past Medical History:  Diagnosis Date   Anemia    BRCA negative 12/2019   MyRisk neg except CDKN2A and 3-MSH3 VUS; IBIS=16.1%   Chronic kidney disease    Diabetes mellitus without complication ([place])    Diverticulosis    Duodenitis    Family history of pancreatic cancer    Heart murmur    Hypertension    Migraine    Past Surgical History:  Procedure Laterality Date    CHOLECYSTECTOMY     laparscopic-Dr Tokiko Diefenderfer   COLONOSCOPY WITH PROPOFOL N/A 04/15/2015   Procedure: COLONOSCOPY WITH PROPOFOL;  Surgeon: Lollie Sails, MD;  Location: [facility] ENDOSCOPY;  Service: Endoscopy;  Laterality: N/A;   ESOPHAGOGASTRODUODENOSCOPY N/A 04/15/2015   Procedure: ESOPHAGOGASTRODUODENOSCOPY (EGD);  Surgeon: Lollie Sails, MD;  Location: [facility] ENDOSCOPY;  Service: Endoscopy;  Laterality: N/A;   KNEE ARTHROSCOPY W/ SYNOVECTOMY Left    LAPAROSCOPIC SUPRACERVICAL HYSTERECTOMY  2009   fibroids/AUB   TUBAL LIGATION Bilateral    Social History   Socioeconomic History   Marital status: Married    Spouse name: Quillian Quince   Number of children: 1   Years of education: masters   Highest education level: Not on file  Occupational History   Occupation: Primary school teacher: [employer]  Tobacco Use   Smoking status: Never   Smokeless tobacco: Never  Vaping Use   Vaping Use: Never used  Substance and Sexual Activity   Alcohol use: No   Drug use: No   Sexual activity: Yes    Partners: Male    Birth control/protection: Surgical, Post-menopausal    Comment: Hysterectomy   Other Topics Concern   Not on file  Social History Narrative   Has one granddaughter  named " [name]" 87 years old (born 2016)   Social Determinants of Radio broadcast assistant Strain: Not on file  Food Insecurity: Not on file  Transportation Needs: Not on file  Physical Activity: Not on file  Stress: Not on file  Social Connections: Not on file  Intimate Partner Violence: Not on file   Social History   Social History Narrative   Has one granddaughter named " [name]" 52 years old (born 2016)     ROS: Negative.     PE: HEENT: Negative. Lungs: Clear. Cardio: RR.  Assessment/Plan:  Proceed with planned endoscopy.   Forest Gleason [facility] 06/13/2021

## 2021-06-13 NOTE — Anesthesia Preprocedure Evaluation (Signed)
Anesthesia Evaluation  Patient identified by MRN, date of birth, ID band Patient awake    Reviewed: Allergy & Precautions, NPO status , Patient's Chart, lab work & pertinent test results  Airway Mallampati: II  TM Distance: >3 FB Neck ROM: Full    Dental  (+) Chipped,    Pulmonary neg pulmonary ROS,    Pulmonary exam normal        Cardiovascular hypertension, Pt. on medications Normal cardiovascular exam+ Valvular Problems/Murmurs      Neuro/Psych  Headaches, negative psych ROS   GI/Hepatic Neg liver ROS, Bowel prep,GERD  Medicated,  Endo/Other  negative endocrine ROSdiabetes, Well Controlled, Oral Hypoglycemic Agents  Renal/GU Renal InsufficiencyRenal disease  negative genitourinary   Musculoskeletal negative musculoskeletal ROS (+)   Abdominal (+) + obese,   Peds negative pediatric ROS (+)  Hematology negative hematology ROS (+) anemia ,   Anesthesia Other Findings Anemia    BRCA negative 12/2019 MyRisk neg except CDKN2A and 3-MSH3 VUS; IBIS=16.1%  Chronic kidney disease    Diabetes mellitus without complication (HCC)   Diverticulosis    Duodenitis    Family history of pancreatic cancer    Heart murmur    Hypertension    Migraine       Reproductive/Obstetrics negative OB ROS                            Anesthesia Physical Anesthesia Plan  ASA: 3  Anesthesia Plan: General   Post-op Pain Management:    Induction: Intravenous  PONV Risk Score and Plan: 2 and Propofol infusion and TIVA  Airway Management Planned: Natural Airway and Nasal Cannula  Additional Equipment:   Intra-op Plan:   Post-operative Plan:   Informed Consent: I have reviewed the patients History and Physical, chart, labs and discussed the procedure including the risks, benefits and alternatives for the proposed anesthesia with the patient or authorized representative who has indicated his/her  understanding and acceptance.       Plan Discussed with: CRNA, Anesthesiologist and Surgeon  Anesthesia Plan Comments:         Anesthesia Quick Evaluation

## 2021-06-13 NOTE — Transfer of Care (Signed)
Immediate Anesthesia Transfer of Care Note  Patient: Jacqueline Romero  Procedure(s) Performed: COLONOSCOPY WITH PROPOFOL  Patient Location: PACU  Anesthesia Type:General  Level of Consciousness: awake, alert  and oriented  Airway & Oxygen Therapy: Patient Spontanous Breathing  Post-op Assessment: Report given to RN and Post -op Vital signs reviewed and stable  Post vital signs: Reviewed and stable  Last Vitals:  Vitals Value Taken Time  BP 138/74 06/13/21 0810  Temp 35.8 C 06/13/21 0810  Pulse 72 06/13/21 0811  Resp 15 06/13/21 0811  SpO2 100 % 06/13/21 0811  Vitals shown include unvalidated device data.  Last Pain:  Vitals:   06/13/21 0810  TempSrc: Temporal  PainSc: Asleep         Complications: No notable events documented.

## 2021-06-13 NOTE — Anesthesia Postprocedure Evaluation (Signed)
Anesthesia Post Note  Patient: Jacqueline Romero  Procedure(s) Performed: COLONOSCOPY WITH PROPOFOL  Patient location during evaluation: Phase II Anesthesia Type: General Level of consciousness: awake and alert, awake and oriented Pain management: pain level controlled Vital Signs Assessment: post-procedure vital signs reviewed and stable Respiratory status: spontaneous breathing, nonlabored ventilation and respiratory function stable Cardiovascular status: blood pressure returned to baseline and stable Postop Assessment: no apparent nausea or vomiting Anesthetic complications: no   No notable events documented.   Last Vitals:  Vitals:   06/13/21 0820 06/13/21 0830  BP: 135/88 (!) 147/89  Pulse: 73 65  Resp: 17 17  Temp:    SpO2: 100% 100%    Last Pain:  Vitals:   06/13/21 0830  TempSrc:   PainSc: 0-No pain                 Manfred Arch

## 2021-06-13 NOTE — Op Note (Signed)
[facility] Gastroenterology Patient Name: Jacqueline Romero Procedure Date: 06/13/2021 7:34 AM MRN: [id number] Account #: [id number] Date of Birth: [birth year] Admit Type: Outpatient Age: 55 Room: [facility] ENDO ROOM 1 Gender: Female Note Status: Finalized Procedure:             Colonoscopy Indications:           Generalized abdominal pain Providers:             Earline Mayotte, MD Medicines:             Propofol per Anesthesia Complications:         No immediate complications. Procedure:             Pre-Anesthesia Assessment:                        - Prior to the procedure, a History and Physical was                         performed, and patient medications, allergies and                         sensitivities were reviewed. The patient's tolerance                         of previous anesthesia was reviewed.                        - The risks and benefits of the procedure and the                         sedation options and risks were discussed with the                         patient. All questions were answered and informed                         consent was obtained.                        After obtaining informed consent, the colonoscope was                         passed under direct vision. Throughout the procedure,                         the patient's blood pressure, pulse, and oxygen                         saturations were monitored continuously. The                         Colonoscope was introduced through the anus and                         advanced to the the cecum, identified by appendiceal                         orifice and ileocecal valve. The colonoscopy was  performed without difficulty. The patient tolerated                         the procedure well. The quality of the bowel                         preparation was good. Findings:      The entire examined colon appeared normal on direct and retroflexion        views. Impression:            - The entire examined colon is normal on direct and                         retroflexion views.                        - No specimens collected. Recommendation:        - Repeat colonoscopy in 10 years for screening                         purposes. Procedure Code(s):     --- Professional ---                        [phone], Colonoscopy, flexible; diagnostic, including                         collection of specimen(s) by brushing or washing, when                         performed (separate procedure) Diagnosis Code(s):     --- Professional ---                        R10.84, Generalized abdominal pain CPT copyright 2019 American Medical Association. All rights reserved. The codes documented in this report are preliminary and upon coder review may  be revised to meet current compliance requirements. Earline Mayotte, MD 06/13/2021 8:07:54 AM This report has been signed electronically. Number of Addenda: 0 Note Initiated On: 06/13/2021 7:34 AM Scope Withdrawal Time: 0 hours 20 minutes 27 seconds  Total Procedure Duration: 0 hours 25 minutes 29 seconds  Estimated Blood Loss:  Estimated blood loss: none.      [facility]

## 2021-06-15 NOTE — Progress Notes (Signed)
Voicemail.  No Message Left. 

## 2021-06-16 ENCOUNTER — Encounter: Payer: Self-pay | Admitting: General Surgery

## 2021-07-31 ENCOUNTER — Ambulatory Visit: Payer: BC Managed Care – PPO | Admitting: Gastroenterology

## 2021-08-29 LAB — HM DIABETES EYE EXAM

## 2021-09-08 ENCOUNTER — Encounter: Payer: Self-pay | Admitting: Family Medicine

## 2021-09-17 ENCOUNTER — Ambulatory Visit: Payer: BC Managed Care – PPO | Admitting: Gastroenterology

## 2021-10-15 ENCOUNTER — Encounter: Payer: Self-pay | Admitting: Family Medicine

## 2021-10-15 ENCOUNTER — Other Ambulatory Visit: Payer: Self-pay

## 2021-10-15 ENCOUNTER — Ambulatory Visit: Payer: BC Managed Care – PPO | Admitting: Family Medicine

## 2021-10-15 ENCOUNTER — Ambulatory Visit (INDEPENDENT_AMBULATORY_CARE_PROVIDER_SITE_OTHER): Payer: BC Managed Care – PPO | Admitting: Family Medicine

## 2021-10-15 VITALS — BP 126/74 | HR 62 | Temp 98.7°F | Resp 16 | Ht 66.0 in | Wt 181.0 lb

## 2021-10-15 DIAGNOSIS — E119 Type 2 diabetes mellitus without complications: Secondary | ICD-10-CM | POA: Diagnosis not present

## 2021-10-15 DIAGNOSIS — Z114 Encounter for screening for human immunodeficiency virus [HIV]: Secondary | ICD-10-CM | POA: Diagnosis not present

## 2021-10-15 DIAGNOSIS — I10 Essential (primary) hypertension: Secondary | ICD-10-CM

## 2021-10-15 DIAGNOSIS — E7800 Pure hypercholesterolemia, unspecified: Secondary | ICD-10-CM

## 2021-10-15 DIAGNOSIS — E6609 Other obesity due to excess calories: Secondary | ICD-10-CM

## 2021-10-15 DIAGNOSIS — K219 Gastro-esophageal reflux disease without esophagitis: Secondary | ICD-10-CM

## 2021-10-15 DIAGNOSIS — Z1159 Encounter for screening for other viral diseases: Secondary | ICD-10-CM | POA: Diagnosis not present

## 2021-10-15 LAB — POCT GLYCOSYLATED HEMOGLOBIN (HGB A1C)
Est. average glucose Bld gHb Est-mCnc: 171
Hemoglobin A1C: 7.6 % — AB (ref 4.0–5.6)

## 2021-10-15 NOTE — Progress Notes (Signed)
° °  SUBJECTIVE:   CHIEF COMPLAINT / HPI:   Hypertension: - Medications: losartan - Compliance: good - Checking BP at home: no - Denies any LE edema, medication SEs, or symptoms of hypotension  Diabetes, Type 2 - Last A1c 7.7 01/2021 - Medications: metformin 1000mg  BID - Compliance: taking daily - Checking BG at home: sometimes - Exercise: walks sometimes - Eye exam: UTD - Foot exam: due - Statin: yes - PNA vaccine: due - Denies symptoms of hypoglycemia, numbness extremities, foot ulcers/trauma  HLD - medications:  crestor - compliance: good - medication SEs: none  GERD - Meds: protonix prn - Symptoms:  occasional heartburn.  -  denies dysphagia has not lost weight denies melena, hematochezia, hematemesis, and coffee ground emesis.    OBJECTIVE:   BP 126/74 (BP Location: Left Arm, Patient Position: Sitting, Cuff Size: Large)    Pulse 62    Temp 98.7 F (37.1 C) (Temporal)    Resp 16    Ht 5\' 6"  (1.676 m)    Wt 181 lb (82.1 kg)    SpO2 97%    BMI 29.21 kg/m   Gen: well appearing, in NAD Card: RRR Lungs: CTAB Ext: WWP, no edema. Foot exam normal.    ASSESSMENT/PLAN:   Hypertension Doing well on current regimen, no changes made today.  GERD (gastroesophageal reflux disease) Doing well on current regimen, no changes made today.  Diabetes mellitus, type 2 Chronic. A1c stable but still slightly above goal of <7. Resistant to medication changes at this time. Recommend continued efforts at weight loss and diet and exercise. Recheck in 3 months, consider addition of SGLT2 inhibitor if above goal.   Hyperlipidemia Tolerant of statin, continue. Plan to recheck lipids in 4 months.  Obesity Contributing to HTN, DM. Recommend weight loss through diet and exercise.      , DO

## 2021-10-15 NOTE — Assessment & Plan Note (Signed)
Chronic. A1c stable but still slightly above goal of <7. Resistant to medication changes at this time. Recommend continued efforts at weight loss and diet and exercise. Recheck in 3 months, consider addition of SGLT2 inhibitor if above goal.

## 2021-10-15 NOTE — Assessment & Plan Note (Signed)
Contributing to HTN, DM. Recommend weight loss through diet and exercise. 

## 2021-10-15 NOTE — Assessment & Plan Note (Signed)
Doing well on current regimen, no changes made today. 

## 2021-10-15 NOTE — Assessment & Plan Note (Signed)
Tolerant of statin, continue. Plan to recheck lipids in 4 months.

## 2021-10-16 LAB — BASIC METABOLIC PANEL
BUN/Creatinine Ratio: 14 (ref 9–23)
BUN: 13 mg/dL (ref 6–24)
CO2: 25 mmol/L (ref 20–29)
Calcium: 9.5 mg/dL (ref 8.7–10.2)
Chloride: 110 mmol/L — ABNORMAL HIGH (ref 96–106)
Creatinine, Ser: 0.9 mg/dL (ref 0.57–1.00)
Glucose: 142 mg/dL — ABNORMAL HIGH (ref 70–99)
Potassium: 4.2 mmol/L (ref 3.5–5.2)
Sodium: 147 mmol/L — ABNORMAL HIGH (ref 134–144)
eGFR: 75 mL/min/{1.73_m2} (ref >59)

## 2021-10-16 LAB — HEPATITIS C ANTIBODY: Hep C Virus Ab: <0.1 s/co ratio (ref 0.0–0.9)

## 2021-10-16 LAB — HIV ANTIBODY (ROUTINE TESTING W REFLEX): HIV Screen 4th Generation wRfx: NONREACTIVE

## 2021-10-30 ENCOUNTER — Ambulatory Visit: Payer: BC Managed Care – PPO | Admitting: Gastroenterology

## 2022-01-06 ENCOUNTER — Encounter: Payer: Self-pay | Admitting: Family Medicine

## 2022-01-07 ENCOUNTER — Other Ambulatory Visit: Payer: Self-pay | Admitting: *Deleted

## 2022-01-07 DIAGNOSIS — I10 Essential (primary) hypertension: Secondary | ICD-10-CM

## 2022-01-07 DIAGNOSIS — E119 Type 2 diabetes mellitus without complications: Secondary | ICD-10-CM

## 2022-01-07 MED ORDER — LOSARTAN POTASSIUM 25 MG PO TABS: 25.0000 mg | ORAL_TABLET | Freq: Every day | ORAL | 3 refills | Status: AC

## 2022-01-07 MED ORDER — METFORMIN HCL 1000 MG PO TABS
1000.0000 mg | ORAL_TABLET | Freq: Two times a day (BID) | ORAL | 3 refills | Status: DC
Start: 2022-01-07 — End: 2024-03-23

## 2022-01-07 NOTE — Telephone Encounter (Signed)
Rxs were sent to pharmacy

## 2022-03-30 NOTE — Progress Notes (Unsigned)
Established patient visit  I,[name],acting as a scribe for Wilhemena Durie, MD.,have documented all relevant documentation on the behalf of Wilhemena Durie, MD,as directed by  Wilhemena Durie, MD while in the presence of Wilhemena Durie, MD.   Patient: Jacqueline Romero   DOB: 1966-08-25   56 y.o. Female  MRN: [id number] Visit Date: 03/31/2022  Today's healthcare provider: Wilhemena Durie, MD   Chief Complaint  Patient presents with   Follow-up   Diabetes   Hypertension   Hyperlipidemia   Subjective    HPI  Patient comes in today for follow-up.  She is doing well overall.  She has no complaints.  She is taking medications as prescribed.  She complains about 1 month of right-sided low back pain that is in the region of her SI joint.  There are no radicular symptoms .  Diabetes Mellitus Type II, follow-up  Lab Results  Component Value Date   HGBA1C 7.6 (A) 10/15/2021   HGBA1C 7.7 (A) 02/04/2021   HGBA1C 7.9 (H) 10/01/2020   Last seen for diabetes 6 months ago.  Management since then includes continuing the same treatment.  Home blood sugar records: fasting range: not checking Most Recent Eye Exam: 08/29/2021  --------------------------------------------------------------------------------------------------- Hypertension, follow-up  BP Readings from Last 3 Encounters:  03/31/22 109/73  10/15/21 126/74  06/13/21 (!) 147/89   Wt Readings from Last 3 Encounters:  03/31/22 180 lb (81.6 kg)  10/15/21 181 lb (82.1 kg)  06/13/21 178 lb (80.7 kg)     She was last seen for hypertension 6 months ago.  Management since that visit includes; taking losartan 25 mg.  Outside blood pressures are not checking.  --------------------------------------------------------------------------------------------------- Lipid/Cholesterol, follow-up  Last Lipid Panel: Lab Results  Component Value Date   CHOL 218 (H) 02/04/2021   LDLCALC 131 (H) 02/04/2021    HDL 60 02/04/2021   TRIG 152 (H) 02/04/2021    She was last seen for this 02/04/2021..  Management since that visit includes; Crestor 40 mg.  Last metabolic panel Lab Results  Component Value Date   GLUCOSE 142 (H) 10/15/2021   NA 147 (H) 10/15/2021   K 4.2 10/15/2021   BUN 13 10/15/2021   CREATININE 0.90 10/15/2021   EGFR 75 10/15/2021   GFRNONAA 75 10/01/2020   CALCIUM 9.5 10/15/2021   AST 9 02/04/2021   ALT 9 02/04/2021   The 10-year ASCVD risk score (Arnett DK, et al., 2019) is: 7.3%  ---------------------------------------------------------------------------------------------------   Medications: Outpatient Medications Prior to Visit  Medication Sig   Cholecalciferol (VITAMIN D) 2000 UNITS CAPS Take 2,000 Units by mouth daily.   ferrous fumarate (HEMOCYTE - 106 MG FE) 325 (106 FE) MG TABS tablet Take 1 tablet by mouth daily.    fluticasone (FLONASE) 50 MCG/ACT nasal spray Place 2 sprays into both nostrils daily.   hydrocortisone 2.5 % cream Apply 1 application topically at bedtime.   losartan (COZAAR) 25 MG tablet Take 1 tablet (25 mg total) by mouth daily.   meloxicam (MOBIC) 15 MG tablet Take 15 mg by mouth daily.   metFORMIN (GLUCOPHAGE) 1000 MG tablet Take 1 tablet (1,000 mg total) by mouth 2 (two) times daily.   Multiple Vitamins-Minerals (WOMENS MULTIVITAMIN PLUS PO) Take 1 tablet by mouth daily.   nortriptyline (PAMELOR) 10 MG capsule Take 10 mg by mouth at bedtime.   pantoprazole (PROTONIX) 40 MG tablet Take 40 mg by mouth daily.   rosuvastatin (CRESTOR) 40 MG tablet Take  1 tablet (40 mg total) by mouth daily.   No facility-administered medications prior to visit.    Review of Systems  Constitutional:  Negative for appetite change, chills, fatigue and fever.  Respiratory:  Negative for chest tightness and shortness of breath.   Cardiovascular:  Negative for chest pain and palpitations.  Gastrointestinal:  Negative for abdominal pain, nausea and vomiting.   Neurological:  Negative for dizziness and weakness.    Last hemoglobin A1c Lab Results  Component Value Date   HGBA1C 7.9 (H) 03/31/2022       Objective    BP 109/73 (BP Location: Right Arm, Patient Position: Sitting, Cuff Size: Normal)   Pulse 73   Resp 16   Wt 180 lb (81.6 kg)   SpO2 98%   BMI 29.05 kg/m  BP Readings from Last 3 Encounters:  03/31/22 109/73  10/15/21 126/74  06/13/21 (!) 147/89   Wt Readings from Last 3 Encounters:  03/31/22 180 lb (81.6 kg)  10/15/21 181 lb (82.1 kg)  06/13/21 178 lb (80.7 kg)      Physical Exam Vitals reviewed.  Constitutional:      Appearance: She is well-developed.  HENT:     Head: Normocephalic.     Right Ear: External ear normal.     Left Ear: External ear normal.     Nose: Nose normal.  Eyes:     General: No scleral icterus.    Conjunctiva/sclera: Conjunctivae normal.  Neck:     Thyroid: No thyromegaly.  Cardiovascular:     Rate and Rhythm: Normal rate and regular rhythm.     Heart sounds: Normal heart sounds.  Pulmonary:     Effort: Pulmonary effort is normal.     Breath sounds: Normal breath sounds.  Abdominal:     Palpations: Abdomen is soft.  Lymphadenopathy:     Cervical: No cervical adenopathy.  Skin:    General: Skin is warm and dry.  Neurological:     General: No focal deficit present.     Mental Status: She is alert and oriented to person, place, and time.     Comments: Normal diabetic monofilament exam of feet.  Psychiatric:        Mood and Affect: Mood normal.        Behavior: Behavior normal.        Thought Content: Thought content normal.        Judgment: Judgment normal.       No results found for any visits on 03/31/22.  Assessment & Plan     1. Type 2 diabetes mellitus without complication, without long-term current use of insulin (HCC) Goal A1c less than 65 in this 56 year old.  No hypoglycemia. - Lipid panel - Hemoglobin A1c - TSH - CBC w/Diff/Platelet - Comprehensive Metabolic  Panel (CMET)  2. Pure hypercholesterolemia Goal less than 70 audio continue rosuvastatin 40 - Lipid panel - Hemoglobin A1c - TSH - CBC w/Diff/Platelet - Comprehensive Metabolic Panel (CMET)  3. Essential hypertension Good control, patient on Cozaar - Lipid panel - Hemoglobin A1c - TSH - CBC w/Diff/Platelet - Comprehensive Metabolic Panel (CMET)  4. Obesity due to excess calories with serious comorbidity, unspecified classification  - Lipid panel - Hemoglobin A1c - TSH - CBC w/Diff/Platelet - Comprehensive Metabolic Panel (CMET)  5. Acute right-sided low back pain without sciatica Pain in the region of the right SI joint.  Try meloxicam and consider physical therapy referral. - DG Lumbar Spine Complete; Future   No follow-ups on file.  I, Wilhemena Durie, MD, have reviewed all documentation for this visit. The documentation on 04/01/22 for the exam, diagnosis, procedures, and orders are all accurate and complete.    Jakory Matsuo Cranford Mon, MD  [facility] [phone] (phone) [phone] (fax)  [place]

## 2022-03-31 ENCOUNTER — Other Ambulatory Visit: Payer: Self-pay | Admitting: Family Medicine

## 2022-03-31 ENCOUNTER — Ambulatory Visit (INDEPENDENT_AMBULATORY_CARE_PROVIDER_SITE_OTHER): Payer: BC Managed Care – PPO | Admitting: Family Medicine

## 2022-03-31 ENCOUNTER — Ambulatory Visit
Admission: RE | Admit: 2022-03-31 | Discharge: 2022-03-31 | Disposition: A | Discharge status: Home / Self Care | Payer: BC Managed Care – PPO | Attending: Family Medicine | Admitting: Family Medicine

## 2022-03-31 ENCOUNTER — Ambulatory Visit
Admission: RE | Admit: 2022-03-31 | Discharge: 2022-03-31 | Disposition: A | Discharge status: Home / Self Care | Payer: BC Managed Care – PPO | Source: Ambulatory Visit | Attending: Family Medicine | Admitting: Family Medicine

## 2022-03-31 ENCOUNTER — Encounter: Payer: Self-pay | Admitting: Family Medicine

## 2022-03-31 VITALS — BP 109/73 | HR 73 | Resp 16 | Wt 180.0 lb

## 2022-03-31 DIAGNOSIS — E7800 Pure hypercholesterolemia, unspecified: Secondary | ICD-10-CM | POA: Diagnosis not present

## 2022-03-31 DIAGNOSIS — E119 Type 2 diabetes mellitus without complications: Secondary | ICD-10-CM | POA: Diagnosis not present

## 2022-03-31 DIAGNOSIS — I10 Essential (primary) hypertension: Secondary | ICD-10-CM

## 2022-03-31 DIAGNOSIS — M545 Low back pain, unspecified: Secondary | ICD-10-CM | POA: Diagnosis present

## 2022-03-31 DIAGNOSIS — Z1231 Encounter for screening mammogram for malignant neoplasm of breast: Secondary | ICD-10-CM

## 2022-03-31 DIAGNOSIS — E6609 Other obesity due to excess calories: Secondary | ICD-10-CM | POA: Diagnosis not present

## 2022-04-01 LAB — COMPREHENSIVE METABOLIC PANEL
ALT: 10 IU/L (ref 0–32)
AST: 11 IU/L (ref 0–40)
Albumin/Globulin Ratio: 1.7 (ref 1.2–2.2)
Albumin: 4.5 g/dL (ref 3.8–4.9)
Alkaline Phosphatase: 79 IU/L (ref 44–121)
BUN/Creatinine Ratio: 10 (ref 9–23)
BUN: 9 mg/dL (ref 6–24)
Bilirubin Total: 0.4 mg/dL (ref 0.0–1.2)
CO2: 24 mmol/L (ref 20–29)
Calcium: 9.6 mg/dL (ref 8.7–10.2)
Chloride: 104 mmol/L (ref 96–106)
Creatinine, Ser: 0.88 mg/dL (ref 0.57–1.00)
Globulin, Total: 2.7 g/dL (ref 1.5–4.5)
Glucose: 147 mg/dL — ABNORMAL HIGH (ref 70–99)
Potassium: 4.3 mmol/L (ref 3.5–5.2)
Sodium: 142 mmol/L (ref 134–144)
Total Protein: 7.2 g/dL (ref 6.0–8.5)
eGFR: 77 mL/min/{1.73_m2} (ref >59)

## 2022-04-01 LAB — CBC WITH DIFFERENTIAL/PLATELET
Basophils Absolute: 0 10*3/uL (ref 0.0–0.2)
Basos: 1 %
EOS (ABSOLUTE): 0.1 10*3/uL (ref 0.0–0.4)
Eos: 2 %
Hematocrit: 44 % (ref 34.0–46.6)
Hemoglobin: 14.8 g/dL (ref 11.1–15.9)
Immature Grans (Abs): 0 10*3/uL (ref 0.0–0.1)
Immature Granulocytes: 0 %
Lymphocytes Absolute: 2 10*3/uL (ref 0.7–3.1)
Lymphs: 45 %
MCH: 29.6 pg (ref 26.6–33.0)
MCHC: 33.6 g/dL (ref 31.5–35.7)
MCV: 88 fL (ref 79–97)
Monocytes Absolute: 0.4 10*3/uL (ref 0.1–0.9)
Monocytes: 9 %
Neutrophils Absolute: 1.8 10*3/uL (ref 1.4–7.0)
Neutrophils: 43 %
Platelets: 233 10*3/uL (ref 150–450)
RBC: 5 x10E6/uL (ref 3.77–5.28)
RDW: 12.1 % (ref 11.7–15.4)
WBC: 4.3 10*3/uL (ref 3.4–10.8)

## 2022-04-01 LAB — LIPID PANEL
Chol/HDL Ratio: 3.8 ratio (ref 0.0–4.4)
Cholesterol, Total: 206 mg/dL — ABNORMAL HIGH (ref 100–199)
HDL: 54 mg/dL (ref >39)
LDL Chol Calc (NIH): 133 mg/dL — ABNORMAL HIGH (ref 0–99)
Triglycerides: 106 mg/dL (ref 0–149)
VLDL Cholesterol Cal: 19 mg/dL (ref 5–40)

## 2022-04-01 LAB — TSH: TSH: 1.19 u[IU]/mL (ref 0.450–4.500)

## 2022-04-01 LAB — HEMOGLOBIN A1C
Est. average glucose Bld gHb Est-mCnc: 180 mg/dL
Hgb A1c MFr Bld: 7.9 % — ABNORMAL HIGH (ref 4.8–5.6)

## 2022-05-27 NOTE — Progress Notes (Unsigned)
PCP: [name]., MD   No chief complaint on file.   HPI:      Jacqueline Romero is a 56 y.o. G1P1001 whose LMP was No LMP recorded. Patient has had a hysterectomy., presents today for her annual examination.  Her menses are absent due to [facility] in 2009 for AUB and fibroids. No PMB. She does not have vasomotor sx.   Sex activity: single partner, contraception - status post hysterectomy. She does not have vaginal dryness.  Last Pap: 05/15/20 Results were: no abnormalities /neg HPV DNA 2019.  Hx of STDs: none  Last mammogram: today ordered by PCP;  8/22 Results were: normal--routine follow-up in 12 months There is a FH of breast cancer in her sister and pancreatic cancer in her mother. There is no FH of ovarian cancer. Pt is MyRisk neg 2021 except 4 VUS, IBIS=16.1% The patient does do self-breast exams.  Colonoscopy: has appt 8/22 with Dr. Bary Castilla; previous one 2016 with diverticulitis  Tobacco use: The patient denies current or previous tobacco use. Alcohol use: none No drug use Exercise: moderately active  She does get adequate calcium and Vitamin D in her diet.  Labs with PCP.   Past Medical History:  Diagnosis Date   Anemia    BRCA negative 12/2019   MyRisk neg except CDKN2A and 3-MSH3 VUS; IBIS=16.1%   Chronic kidney disease    Diabetes mellitus without complication ([place])    Diverticulosis    Duodenitis    Family history of pancreatic cancer    Heart murmur    Hypertension    Migraine     Past Surgical History:  Procedure Laterality Date   CHOLECYSTECTOMY     laparscopic-Dr Byrnett   COLONOSCOPY WITH PROPOFOL N/A 04/15/2015   Procedure: COLONOSCOPY WITH PROPOFOL;  Surgeon: Lollie Sails, MD;  Location: [facility] ENDOSCOPY;  Service: Endoscopy;  Laterality: N/A;   COLONOSCOPY WITH PROPOFOL N/A 06/13/2021   Procedure: COLONOSCOPY WITH PROPOFOL;  Surgeon: Robert Bellow, MD;  Location: [facility] ENDOSCOPY;  Service: Endoscopy;  Laterality: N/A;    ESOPHAGOGASTRODUODENOSCOPY N/A 04/15/2015   Procedure: ESOPHAGOGASTRODUODENOSCOPY (EGD);  Surgeon: Lollie Sails, MD;  Location: [facility] ENDOSCOPY;  Service: Endoscopy;  Laterality: N/A;   KNEE ARTHROSCOPY W/ SYNOVECTOMY Left    LAPAROSCOPIC SUPRACERVICAL HYSTERECTOMY  2009   fibroids/AUB   TUBAL LIGATION Bilateral     Family History  Problem Relation Age of Onset   Pancreatic cancer Mother 20   Throat cancer Father    Hypertension Father    Hypertension Sister    Diabetes Sister    Breast cancer Sister 3   Hypertension Brother    Diabetes Maternal Aunt    Hypertension Maternal Aunt    Hypertension Maternal Uncle    Diabetes Maternal Uncle     Social History   Socioeconomic History   Marital status: Married    Spouse name: Quillian Quince   Number of children: 1   Years of education: masters   Highest education level: Not on file  Occupational History   Occupation: Primary school teacher: [employer]  Tobacco Use   Smoking status: Never   Smokeless tobacco: Never  Vaping Use   Vaping Use: Never used  Substance and Sexual Activity   Alcohol use: No   Drug use: No   Sexual activity: Yes    Partners: Male    Birth control/protection: Surgical, Post-menopausal    Comment: Hysterectomy   Other Topics Concern   Not  on file  Social History Narrative   Has one granddaughter named " [name]" 12 years old (born 2016)   Social Determinants of Health   Financial Resource Strain: Not on file  Food Insecurity: Not on file  Transportation Needs: Not on file  Physical Activity: Not on file  Stress: Not on file  Social Connections: Not on file  Intimate Partner Violence: Not on file     Current Outpatient Medications:    Cholecalciferol (VITAMIN D) 2000 UNITS CAPS, Take 2,000 Units by mouth daily., Disp: , Rfl:    ferrous fumarate (HEMOCYTE - 106 MG FE) 325 (106 FE) MG TABS tablet, Take 1 tablet by mouth daily. , Disp: , Rfl:    fluticasone (FLONASE) 50 MCG/ACT nasal  spray, Place 2 sprays into both nostrils daily., Disp: 16 g, Rfl: 6   hydrocortisone 2.5 % cream, Apply 1 application topically at bedtime., Disp: 30 g, Rfl: 0   losartan (COZAAR) 25 MG tablet, Take 1 tablet (25 mg total) by mouth daily., Disp: 90 tablet, Rfl: 3   meloxicam (MOBIC) 15 MG tablet, Take 15 mg by mouth daily., Disp: , Rfl:    metFORMIN (GLUCOPHAGE) 1000 MG tablet, Take 1 tablet (1,000 mg total) by mouth 2 (two) times daily., Disp: 180 tablet, Rfl: 3   Multiple Vitamins-Minerals (WOMENS MULTIVITAMIN PLUS PO), Take 1 tablet by mouth daily., Disp: , Rfl:    nortriptyline (PAMELOR) 10 MG capsule, Take 10 mg by mouth at bedtime., Disp: , Rfl:    pantoprazole (PROTONIX) 40 MG tablet, Take 40 mg by mouth daily., Disp: , Rfl:    rosuvastatin (CRESTOR) 40 MG tablet, Take 1 tablet (40 mg total) by mouth daily., Disp: 90 tablet, Rfl: 3     ROS:  Review of Systems  Constitutional:  Negative for fatigue, fever and unexpected weight change.  Respiratory:  Negative for cough, shortness of breath and wheezing.   Cardiovascular:  Negative for chest pain, palpitations and leg swelling.  Gastrointestinal:  Negative for blood in stool, constipation, diarrhea, nausea and vomiting.  Endocrine: Negative for cold intolerance, heat intolerance and polyuria.  Genitourinary:  Negative for dyspareunia, dysuria, flank pain, frequency, genital sores, hematuria, menstrual problem, pelvic pain, urgency, vaginal bleeding, vaginal discharge and vaginal pain.  Musculoskeletal:  Negative for back pain, joint swelling and myalgias.  Skin:  Negative for rash.  Neurological:  Negative for dizziness, syncope, light-headedness, numbness and headaches.  Hematological:  Negative for adenopathy.  Psychiatric/Behavioral:  Negative for agitation, confusion, sleep disturbance and suicidal ideas. The patient is not nervous/anxious.    BREAST: No symptoms    Objective: There were no vitals taken for this  visit.   Physical Exam Constitutional:      Appearance: She is well-developed.  Genitourinary:     Vulva normal.     Right Labia: No rash, tenderness or lesions.    Left Labia: No tenderness, lesions or rash.    No vaginal discharge, erythema or tenderness.      Right Adnexa: not tender and no mass present.    Left Adnexa: not tender and no mass present.    No cervical friability or polyp.     Uterus is not tender.     Uterus is absent.  Breasts:    Right: No mass, nipple discharge, skin change or tenderness.     Left: No mass, nipple discharge, skin change or tenderness.  Neck:     Thyroid: No thyromegaly.  Cardiovascular:     Rate and Rhythm:  Normal rate and regular rhythm.     Heart sounds: Normal heart sounds. No murmur heard. Pulmonary:     Effort: Pulmonary effort is normal.     Breath sounds: Normal breath sounds.  Abdominal:     Palpations: Abdomen is soft.     Tenderness: There is no abdominal tenderness. There is no guarding or rebound.  Musculoskeletal:        General: Normal range of motion.     Cervical back: Normal range of motion.  Lymphadenopathy:     Cervical: No cervical adenopathy.  Neurological:     General: No focal deficit present.     Mental Status: She is alert and oriented to person, place, and time.     Cranial Nerves: No cranial nerve deficit.  Skin:    General: Skin is warm and dry.  Psychiatric:        Mood and Affect: Mood normal.        Behavior: Behavior normal.        Thought Content: Thought content normal.        Judgment: Judgment normal.  Vitals reviewed.     Assessment/Plan:  Encounter for annual routine gynecological examination  Encounter for screening mammogram for malignant neoplasm of breast; pt has mammo sched  Family history of breast cancer--Pt is MyRisk neg.           GYN counsel breast self exam, mammography screening, menopause, adequate intake of calcium and vitamin D, diet and exercise    F/U  No  follow-ups on file.  Jacqueline Cherubin B. Koriana Stepien, PA-C 05/27/2022 7:39 PM

## 2022-05-28 ENCOUNTER — Encounter: Payer: Self-pay | Admitting: Obstetrics and Gynecology

## 2022-05-28 ENCOUNTER — Ambulatory Visit
Admission: RE | Admit: 2022-05-28 | Discharge: 2022-05-28 | Disposition: A | Discharge status: Home / Self Care | Payer: BC Managed Care – PPO | Source: Ambulatory Visit | Attending: Family Medicine | Admitting: Family Medicine

## 2022-05-28 ENCOUNTER — Ambulatory Visit (INDEPENDENT_AMBULATORY_CARE_PROVIDER_SITE_OTHER): Payer: BC Managed Care – PPO | Admitting: Obstetrics and Gynecology

## 2022-05-28 VITALS — BP 110/60 | Ht 66.0 in | Wt 178.0 lb

## 2022-05-28 DIAGNOSIS — Z1231 Encounter for screening mammogram for malignant neoplasm of breast: Secondary | ICD-10-CM

## 2022-05-28 DIAGNOSIS — Z01419 Encounter for gynecological examination (general) (routine) without abnormal findings: Secondary | ICD-10-CM

## 2022-05-28 DIAGNOSIS — Z1211 Encounter for screening for malignant neoplasm of colon: Secondary | ICD-10-CM

## 2022-05-28 DIAGNOSIS — Z803 Family history of malignant neoplasm of breast: Secondary | ICD-10-CM | POA: Diagnosis not present

## 2022-05-28 NOTE — Patient Instructions (Signed)
I value your feedback and you entrusting us with your care. If you get a [facility] patient survey, I would appreciate you taking the time to let us know about your experience today. Thank you! ? ? ?

## 2022-07-16 ENCOUNTER — Encounter: Payer: BC Managed Care – PPO | Admitting: Family Medicine

## 2022-08-31 ENCOUNTER — Ambulatory Visit: Payer: BC Managed Care – PPO | Admitting: Family Medicine

## 2022-09-02 ENCOUNTER — Encounter: Payer: Self-pay | Admitting: Physician Assistant

## 2022-09-02 ENCOUNTER — Ambulatory Visit (INDEPENDENT_AMBULATORY_CARE_PROVIDER_SITE_OTHER): Payer: BC Managed Care – PPO | Admitting: Physician Assistant

## 2022-09-02 VITALS — BP 139/66 | HR 93 | Ht 66.0 in | Wt 178.9 lb

## 2022-09-02 DIAGNOSIS — E1169 Type 2 diabetes mellitus with other specified complication: Secondary | ICD-10-CM | POA: Diagnosis not present

## 2022-09-02 DIAGNOSIS — E1159 Type 2 diabetes mellitus with other circulatory complications: Secondary | ICD-10-CM

## 2022-09-02 DIAGNOSIS — I152 Hypertension secondary to endocrine disorders: Secondary | ICD-10-CM | POA: Diagnosis not present

## 2022-09-02 DIAGNOSIS — E119 Type 2 diabetes mellitus without complications: Secondary | ICD-10-CM | POA: Diagnosis not present

## 2022-09-02 DIAGNOSIS — E785 Hyperlipidemia, unspecified: Secondary | ICD-10-CM

## 2022-09-02 LAB — POCT GLYCOSYLATED HEMOGLOBIN (HGB A1C): Hemoglobin A1C: 8.5 % — AB (ref 4.0–5.6)

## 2022-09-02 MED ORDER — ACCU-CHEK AVIVA PLUS VI STRP: ORAL_STRIP | 3 refills | Status: AC

## 2022-09-02 MED ORDER — SITAGLIPTIN PHOSPHATE 100 MG PO TABS: 100.0000 mg | ORAL_TABLET | Freq: Every day | ORAL | 1 refills | Status: DC

## 2022-09-02 MED ORDER — ACCU-CHEK MULTICLIX LANCETS MISC: 3 refills | Status: AC

## 2022-09-02 MED ORDER — ACCU-CHEK AVIVA PLUS W/DEVICE KIT: PACK | 0 refills | Status: AC

## 2022-09-02 NOTE — Addendum Note (Signed)
Addended byAlfredia Ferguson on: 09/02/2022 04:05 PM   Modules accepted: Orders

## 2022-09-02 NOTE — Assessment & Plan Note (Addendum)
Last A1c 7.9%, today 8.5%  Managed with metofrmin 1000 mg bid. Discussed adding januvia 100 mg, pt agrees  On arb Not on statin but last labs from >3 mo ago. Repeat fasting lipids Foot exam utd Uacr ordered today.  F/u 4 mo

## 2022-09-02 NOTE — Assessment & Plan Note (Signed)
Pt not on statin  The 10-year ASCVD risk score (Arnett DK, et al., 2019) is: 16.4% Repeat fasting lipids

## 2022-09-02 NOTE — Assessment & Plan Note (Signed)
Managed with losartan 25 mg  In appropriate range today  Reviewed cmp F/u 6 mo

## 2022-09-02 NOTE — Progress Notes (Signed)
I,Sha'taria Tyson,acting as a Education administrator for [employer], PA-C.,have documented all relevant documentation on the behalf of Mikey Kirschner, PA-C,as directed by  Mikey Kirschner, PA-C while in the presence of Mikey Kirschner, PA-C.   Established patient visit   Patient: Jacqueline Romero   DOB: 05/26/1966   56 y.o. Female  MRN: [id number] Visit Date: 09/02/2022  Today's healthcare provider: Mikey Kirschner, PA-C   Cc. Dm II f/u   Subjective    HPI  Diabetes Mellitus Type II, Follow-up  Lab Results  Component Value Date   HGBA1C 7.9 (H) 03/31/2022   HGBA1C 7.6 (A) 10/15/2021   HGBA1C 7.7 (A) 02/04/2021   Wt Readings from Last 3 Encounters:  09/02/22 178 lb 14.4 oz (81.1 kg)  05/28/22 178 lb (80.7 kg)  03/31/22 180 lb (81.6 kg)   Last seen for diabetes 5 months ago.  Management since then includes continue current treatment. She reports excellent compliance with treatment. She is not having side effects. Symptoms: No fatigue No foot ulcerations  No appetite changes No nausea  No paresthesia of the feet  No polydipsia  No polyuria No visual disturbances   No vomiting     Home blood sugar records:  not being checked  Episodes of hypoglycemia? No   Current insulin regiment: none Most Recent Eye Exam: Sep 29 2022 Current exercise: none Current diet habits: well balanced  Pertinent Labs: Lab Results  Component Value Date   CHOL 206 (H) 03/31/2022   HDL 54 03/31/2022   LDLCALC 133 (H) 03/31/2022   TRIG 106 03/31/2022   CHOLHDL 3.8 03/31/2022   Lab Results  Component Value Date   NA 142 03/31/2022   K 4.3 03/31/2022   CREATININE 0.88 03/31/2022   EGFR 77 03/31/2022   MICROALBUR 20 02/11/2017     ---------------------------------------------------------------------------------------------------   Medications: Outpatient Medications Prior to Visit  Medication Sig   Cholecalciferol (VITAMIN D) 2000 UNITS CAPS Take 2,000 Units by mouth daily.   ferrous  fumarate (HEMOCYTE - 106 MG FE) 325 (106 FE) MG TABS tablet Take 1 tablet by mouth daily.    hydrocortisone 2.5 % cream Apply 1 application topically at bedtime.   losartan (COZAAR) 25 MG tablet Take 1 tablet (25 mg total) by mouth daily.   metFORMIN (GLUCOPHAGE) 1000 MG tablet Take 1 tablet (1,000 mg total) by mouth 2 (two) times daily.   Multiple Vitamins-Minerals (WOMENS MULTIVITAMIN PLUS PO) Take 1 tablet by mouth daily.   No facility-administered medications prior to visit.    Review of Systems  Constitutional:  Negative for fatigue and fever.  Respiratory:  Negative for cough and shortness of breath.   Cardiovascular:  Negative for chest pain and leg swelling.  Gastrointestinal:  Negative for abdominal pain.  Neurological:  Negative for dizziness and headaches.       Objective    Blood pressure 139/66, pulse 93, height _0  (1.676 m), weight 178 lb 14.4 oz (81.1 kg), SpO2 100 %.    Physical Exam Constitutional:      General: She is awake.     Appearance: She is well-developed.  HENT:     Head: Normocephalic.  Eyes:     Conjunctiva/sclera: Conjunctivae normal.  Cardiovascular:     Rate and Rhythm: Normal rate and regular rhythm.     Heart sounds: Normal heart sounds.  Pulmonary:     Effort: Pulmonary effort is normal.     Breath sounds: Normal breath sounds.  Skin:    General:  Skin is warm.  Neurological:     Mental Status: She is alert and oriented to person, place, and time.  Psychiatric:        Attention and Perception: Attention normal.        Mood and Affect: Mood normal.        Speech: Speech normal.        Behavior: Behavior is cooperative.      No results found for any visits on 09/02/22.  Assessment & Plan     Problem List Items Addressed This Visit       Cardiovascular and Mediastinum   Hypertension    Managed with losartan 25 mg  In appropriate range today  Reviewed cmp F/u 6 mo        Endocrine   Diabetes mellitus, type 2 (HCC) -  Primary    Last A1c 7.9%, today 8.5%  Managed with metofrmin 1000 mg bid. Discussed adding januvia 100 mg, pt agrees  On arb Not on statin but last labs from >3 mo ago. Repeat fasting lipids Foot exam utd Uacr ordered today.  F/u 4 mo       Relevant Medications   sitaGLIPtin (JANUVIA) 100 MG tablet   Lancets (ACCU-CHEK MULTICLIX) lancets   glucose blood (ACCU-CHEK AVIVA PLUS) test strip   Blood Glucose Monitoring Suppl (ACCU-CHEK AVIVA PLUS) w/Device KIT   Other Relevant Orders   POCT HgB A1C   Urine Microalbumin w/creat. ratio   Hyperlipidemia associated with type 2 diabetes mellitus ([place])    Pt not on statin  The 10-year ASCVD risk score (Arnett DK, et al., 2019) is: 16.4% Repeat fasting lipids      Relevant Medications   sitaGLIPtin (JANUVIA) 100 MG tablet   Other Relevant Orders   Lipid Profile   Comprehensive Metabolic Panel (CMET)    Return in about 4 months (around 01/01/2023) for DMII.      I, Mikey Kirschner, PA-C have reviewed all documentation for this visit. The documentation on  09/02/2022 for the exam, diagnosis, procedures, and orders are all accurate and complete.  Mikey Kirschner, PA-C [facility] [address] [place], [place], [address] Office: [phone] Fax: [place]

## 2022-09-08 ENCOUNTER — Ambulatory Visit: Payer: BC Managed Care – PPO | Admitting: Physician Assistant

## 2022-09-15 ENCOUNTER — Ambulatory Visit: Payer: BC Managed Care – PPO | Admitting: Family Medicine

## 2022-09-29 LAB — HM DIABETES EYE EXAM

## 2022-10-03 ENCOUNTER — Telehealth: Payer: BC Managed Care – PPO | Admitting: Nurse Practitioner

## 2022-10-03 DIAGNOSIS — J029 Acute pharyngitis, unspecified: Secondary | ICD-10-CM | POA: Diagnosis not present

## 2022-10-03 NOTE — Progress Notes (Signed)
  E-Visit for Sore Throat  We are sorry that you are not feeling well.  Here is how we plan to help!  Your symptoms indicate a likely viral infection (Pharyngitis).   Pharyngitis is inflammation in the back of the throat which can cause a sore throat, scratchiness and sometimes difficulty swallowing.   Pharyngitis is typically caused by a respiratory virus and will just run its course.  Please keep in mind that your symptoms could last up to 10 days.  For throat pain, we recommend over the counter oral pain relief medications such as acetaminophen or aspirin, or anti-inflammatory medications such as ibuprofen or naproxen sodium.  Topical treatments such as oral throat lozenges or sprays may be used as needed.  Avoid close contact with loved ones, especially the very young and elderly.  Remember to wash your hands thoroughly throughout the day as this is the number one way to prevent the spread of infection and wipe down door knobs and counters with disinfectant.  After careful review of your answers, I would not recommend an antibiotic for your condition.  Antibiotics should not be used to treat conditions that we suspect are caused by viruses like the virus that causes the common cold or flu. However, some people can have Strep with atypical symptoms. You may need formal testing in clinic or office to confirm if your symptoms continue or worsen.  Providers prescribe antibiotics to treat infections caused by bacteria. Antibiotics are very powerful in treating bacterial infections when they are used properly.  To maintain their effectiveness, they should be used only when necessary.  Overuse of antibiotics has resulted in the development of super bugs that are resistant to treatment!    Home Care: Only take medications as instructed by your medical team. Do not drink alcohol while taking these medications. A steam or ultrasonic humidifier can help congestion.  You can place a towel over your head and  breathe in the steam from hot water coming from a faucet. Avoid close contacts especially the very young and the elderly. Cover your mouth when you cough or sneeze. Always remember to wash your hands.  Get Help Right Away If: You develop worsening fever or throat pain. You develop a severe head ache or visual changes. Your symptoms persist after you have completed your treatment plan.  Make sure you Understand these instructions. Will watch your condition. Will get help right away if you are not doing well or get worse.   Thank you for choosing an e-visit.  Your e-visit answers were reviewed by a board certified advanced clinical practitioner to complete your personal care plan. Depending upon the condition, your plan could have included both over the counter or prescription medications.  Please review your pharmacy choice. Make sure the pharmacy is open so you can pick up prescription now. If there is a problem, you may contact your provider through MyChart messaging and have the prescription routed to another pharmacy.  Your safety is important to us. If you have drug allergies check your prescription carefully.   For the next 24 hours you can use MyChart to ask questions about today's visit, request a non-urgent call back, or ask for a work or school excuse. You will get an email in the next two days asking about your experience. I hope that your e-visit has been valuable and will speed your recovery.  Jacqueline Trevyn Lumpkin, FNP   5-10 minutes spent reviewing and documenting in chart.  

## 2022-12-16 ENCOUNTER — Ambulatory Visit: Payer: BC Managed Care – PPO | Admitting: Physician Assistant

## 2023-03-25 ENCOUNTER — Ambulatory Visit: Payer: BC Managed Care – PPO | Admitting: Physician Assistant

## 2023-03-25 ENCOUNTER — Encounter: Payer: Self-pay | Admitting: Physician Assistant

## 2023-03-25 VITALS — BP 131/70 | HR 78 | Ht 66.0 in | Wt 174.3 lb

## 2023-03-25 DIAGNOSIS — E785 Hyperlipidemia, unspecified: Secondary | ICD-10-CM

## 2023-03-25 DIAGNOSIS — E1169 Type 2 diabetes mellitus with other specified complication: Secondary | ICD-10-CM

## 2023-03-25 DIAGNOSIS — E1165 Type 2 diabetes mellitus with hyperglycemia: Secondary | ICD-10-CM

## 2023-03-25 DIAGNOSIS — E1159 Type 2 diabetes mellitus with other circulatory complications: Secondary | ICD-10-CM

## 2023-03-25 DIAGNOSIS — I152 Hypertension secondary to endocrine disorders: Secondary | ICD-10-CM

## 2023-03-25 LAB — POCT GLYCOSYLATED HEMOGLOBIN (HGB A1C): Hemoglobin A1C: 7.7 % — AB (ref 4.0–5.6)

## 2023-03-25 MED ORDER — GLIPIZIDE 5 MG PO TABS: 5.0000 mg | ORAL_TABLET | Freq: Every day | ORAL | 1 refills | Status: DC

## 2023-03-25 NOTE — Assessment & Plan Note (Signed)
Manages with losartan 25 mg  Well controlled Ordered cmp F/u 6 mo

## 2023-03-25 NOTE — Progress Notes (Deleted)
      Established patient visit   Patient: Jacqueline Romero   DOB: May 22, 1966   57 y.o. Female  MRN: [id number] Visit Date: 03/25/2023  Today's healthcare provider: Alfredia Ferguson, PA-C   No chief complaint on file.  Subjective    HPI  -shingles: decliend -pap smear: patient has obgyn -146 fasting sugar at home -no low readings -no insulin -UTD on ye exam -No symptoms -POCT A1c 7.7%  Medications: Outpatient Medications Prior to Visit  Medication Sig   Blood Glucose Monitoring Suppl (ACCU-CHEK AVIVA PLUS) w/Device KIT Use to check blood sugar fasting daily and as needed   Cholecalciferol (VITAMIN D) 2000 UNITS CAPS Take 2,000 Units by mouth daily.   ferrous fumarate (HEMOCYTE - 106 MG FE) 325 (106 FE) MG TABS tablet Take 1 tablet by mouth daily.    glucose blood (ACCU-CHEK AVIVA PLUS) test strip Use to check blood sugar fasting daily and as needed   hydrocortisone 2.5 % cream Apply 1 application topically at bedtime.   Lancets (ACCU-CHEK MULTICLIX) lancets Use to check blood sugar fasting daily and as needed   losartan (COZAAR) 25 MG tablet Take 1 tablet (25 mg total) by mouth daily.   metFORMIN (GLUCOPHAGE) 1000 MG tablet Take 1 tablet (1,000 mg total) by mouth 2 (two) times daily.   Multiple Vitamins-Minerals (WOMENS MULTIVITAMIN PLUS PO) Take 1 tablet by mouth daily.   sitaGLIPtin (JANUVIA) 100 MG tablet Take 1 tablet (100 mg total) by mouth daily.   No facility-administered medications prior to visit.    Review of Systems  {Labs  Heme  Chem  Endocrine  Serology  Results Review (optional):23779}   Objective    There were no vitals taken for this visit. {Show previous vital signs (optional):23777}  Physical Exam  ***  No results found for any visits on 03/25/23.  Assessment & Plan     ***  No follow-ups on file.      {provider attestation***:1}   Alfredia Ferguson, PA-C  [facility] [phone] (phone) [phone]  (fax)  [facility]

## 2023-03-25 NOTE — Progress Notes (Signed)
Established patient visit   Patient: Jacqueline Romero   DOB: Oct 19, 1966   57 y.o. Female  MRN: [id number] Visit Date: 03/25/2023  Today's healthcare provider: Alfredia Ferguson, PA-C   Cc. DM f/u  Subjective    HPI  Diabetes Mellitus Type II, Follow-up  Lab Results  Component Value Date   HGBA1C 8.5 (A) 09/02/2022   HGBA1C 7.9 (H) 03/31/2022   HGBA1C 7.6 (A) 10/15/2021   Wt Readings from Last 3 Encounters:  03/25/23 174 lb 4.8 oz (79.1 kg)  09/02/22 178 lb 14.4 oz (81.1 kg)  05/28/22 178 lb (80.7 kg)   Reports better management of diet/exercise. Denies numbness or paresthesias in feet.   Home blood sugar records: 143-152  Episodes of hypoglycemia? No   Pertinent Labs: Lab Results  Component Value Date   CHOL 206 (H) 03/31/2022   HDL 54 03/31/2022   LDLCALC 133 (H) 03/31/2022   TRIG 106 03/31/2022   CHOLHDL 3.8 03/31/2022   Lab Results  Component Value Date   NA 142 03/31/2022   K 4.3 03/31/2022   CREATININE 0.88 03/31/2022   EGFR 77 03/31/2022     ---------------------------------------------------------------------------------------------------  Hypertension, follow-up  BP Readings from Last 3 Encounters:  03/25/23 131/70  09/02/22 139/66  05/28/22 110/60   Wt Readings from Last 3 Encounters:  03/25/23 174 lb 4.8 oz (79.1 kg)  09/02/22 178 lb 14.4 oz (81.1 kg)  05/28/22 178 lb (80.7 kg)     Pertinent labs Lab Results  Component Value Date   CHOL 206 (H) 03/31/2022   HDL 54 03/31/2022   LDLCALC 133 (H) 03/31/2022   TRIG 106 03/31/2022   CHOLHDL 3.8 03/31/2022   Lab Results  Component Value Date   NA 142 03/31/2022   K 4.3 03/31/2022   CREATININE 0.88 03/31/2022   EGFR 77 03/31/2022   GLUCOSE 147 (H) 03/31/2022   TSH 1.190 03/31/2022     The 10-year ASCVD risk score (Arnett DK, et al., 2019) is:  14.5%  ---------------------------------------------------------------------------------------------------   Medications: Outpatient Medications Prior to Visit  Medication Sig   Blood Glucose Monitoring Suppl (ACCU-CHEK AVIVA PLUS) w/Device KIT Use to check blood sugar fasting daily and as needed   Cholecalciferol (VITAMIN D) 2000 UNITS CAPS Take 2,000 Units by mouth daily.   glucose blood (ACCU-CHEK AVIVA PLUS) test strip Use to check blood sugar fasting daily and as needed   hydrocortisone 2.5 % cream Apply 1 application topically at bedtime.   Lancets (ACCU-CHEK MULTICLIX) lancets Use to check blood sugar fasting daily and as needed   losartan (COZAAR) 25 MG tablet Take 1 tablet (25 mg total) by mouth daily.   metFORMIN (GLUCOPHAGE) 1000 MG tablet Take 1 tablet (1,000 mg total) by mouth 2 (two) times daily.   Multiple Vitamins-Minerals (WOMENS MULTIVITAMIN PLUS PO) Take 1 tablet by mouth daily.   sitaGLIPtin (JANUVIA) 100 MG tablet Take 1 tablet (100 mg total) by mouth daily.   [DISCONTINUED] ferrous fumarate (HEMOCYTE - 106 MG FE) 325 (106 FE) MG TABS tablet Take 1 tablet by mouth daily.  (Patient not taking: Reported on 03/25/2023)   No facility-administered medications prior to visit.    Review of Systems  Constitutional:  Negative for chills and fever.  Neurological:  Negative for dizziness and headaches.      Objective    BP 131/70 (BP Location: Left Arm, Patient Position: Sitting, Cuff Size: Normal)   Pulse 78   Ht 5\' 6"  (1.676 m)   Wt  174 lb 4.8 oz (79.1 kg)   SpO2 100%   BMI 28.13 kg/m   Physical Exam Constitutional:      Appearance: Normal appearance. She is not ill-appearing.  Cardiovascular:     Rate and Rhythm: Normal rate and regular rhythm.     Heart sounds: Normal heart sounds.  Pulmonary:     Effort: Pulmonary effort is normal.     Breath sounds: Normal breath sounds.  Musculoskeletal:     Right lower leg: No edema.     Left lower leg: No edema.   Neurological:     Mental Status: She is oriented to person, place, and time.      No results found for any visits on 03/25/23.  Assessment & Plan     Problem List Items Addressed This Visit       Cardiovascular and Mediastinum   Hypertension associated with diabetes (HCC)    Manages with losartan 25 mg  Well controlled Ordered cmp F/u 6 mo      Relevant Medications   glipiZIDE (GLUCOTROL) 5 MG tablet     Endocrine   Diabetes mellitus, type 2 (HCC) - Primary    Last A1c 8.5%, improved to 7.7% with addition of januvia 100 mg. Manages with metformin 1000 mg once daily and januvia 100 mg . Adding glipizide 5 mg in AM w/ meal. Advised cont monitor BS  Foot exam needed next visit  On arb, no statin? F/u 4 mo      Relevant Medications   glipiZIDE (GLUCOTROL) 5 MG tablet   Other Relevant Orders   POCT glycosylated hemoglobin (Hb A1C)   CBC w/Diff/Platelet   Comprehensive Metabolic Panel (CMET)   Urine Microalbumin w/creat. ratio   Hyperlipidemia associated with type 2 diabetes mellitus (HCC)    No statin Repeat fasting lipids Needs statin added. No allergies or adverse reactions recorded in the past      Relevant Medications   glipiZIDE (GLUCOTROL) 5 MG tablet   Other Relevant Orders   Comprehensive Metabolic Panel (CMET)   Lipid Profile     Return in about 4 weeks (around 04/22/2023) for DMII.      I, Alfredia Ferguson, PA-C have reviewed all documentation for this visit. The documentation on  03/25/23   for the exam, diagnosis, procedures, and orders are all accurate and complete.  Alfredia Ferguson, PA-C [facility] [address] [place], [place], [address] Office: [phone] Fax: [phone]   [facility]

## 2023-03-25 NOTE — Assessment & Plan Note (Signed)
No statin Repeat fasting lipids Needs statin added. No allergies or adverse reactions recorded in the past

## 2023-03-25 NOTE — Assessment & Plan Note (Signed)
Last A1c 8.5%, improved to 7.7% with addition of januvia 100 mg. Manages with metformin 1000 mg once daily and januvia 100 mg . Adding glipizide 5 mg in AM w/ meal. Advised cont monitor BS  Foot exam needed next visit  On arb, no statin? F/u 4 mo

## 2023-03-26 LAB — MICROALBUMIN / CREATININE URINE RATIO
Creatinine, Urine: 152.9 mg/dL
Microalb/Creat Ratio: 5 mg/g creat (ref 0–29)
Microalbumin, Urine: 8 ug/mL

## 2023-04-06 ENCOUNTER — Other Ambulatory Visit: Payer: Self-pay | Admitting: Physician Assistant

## 2023-04-06 DIAGNOSIS — E1169 Type 2 diabetes mellitus with other specified complication: Secondary | ICD-10-CM

## 2023-04-06 LAB — LIPID PANEL
Chol/HDL Ratio: 3 ratio (ref 0.0–4.4)
Cholesterol, Total: 190 mg/dL (ref 100–199)
HDL: 64 mg/dL (ref >39)
LDL Chol Calc (NIH): 113 mg/dL — ABNORMAL HIGH (ref 0–99)
Triglycerides: 69 mg/dL (ref 0–149)
VLDL Cholesterol Cal: 13 mg/dL (ref 5–40)

## 2023-04-06 LAB — CBC WITH DIFFERENTIAL/PLATELET
Basophils Absolute: 0 10*3/uL (ref 0.0–0.2)
Basos: 1 %
EOS (ABSOLUTE): 0.1 10*3/uL (ref 0.0–0.4)
Eos: 3 %
Hematocrit: 41.6 % (ref 34.0–46.6)
Hemoglobin: 14 g/dL (ref 11.1–15.9)
Immature Grans (Abs): 0 10*3/uL (ref 0.0–0.1)
Immature Granulocytes: 0 %
Lymphocytes Absolute: 2.2 10*3/uL (ref 0.7–3.1)
Lymphs: 45 %
MCH: 29.6 pg (ref 26.6–33.0)
MCHC: 33.7 g/dL (ref 31.5–35.7)
MCV: 88 fL (ref 79–97)
Monocytes Absolute: 0.4 10*3/uL (ref 0.1–0.9)
Monocytes: 9 %
Neutrophils Absolute: 2 10*3/uL (ref 1.4–7.0)
Neutrophils: 42 %
Platelets: 218 10*3/uL (ref 150–450)
RBC: 4.73 x10E6/uL (ref 3.77–5.28)
RDW: 12.3 % (ref 11.7–15.4)
WBC: 4.7 10*3/uL (ref 3.4–10.8)

## 2023-04-06 LAB — COMPREHENSIVE METABOLIC PANEL
ALT: 7 IU/L (ref 0–32)
AST: 12 IU/L (ref 0–40)
Albumin: 4.4 g/dL (ref 3.8–4.9)
Alkaline Phosphatase: 71 IU/L (ref 44–121)
BUN/Creatinine Ratio: 12 (ref 9–23)
BUN: 12 mg/dL (ref 6–24)
Bilirubin Total: <0.2 mg/dL (ref 0.0–1.2)
CO2: 21 mmol/L (ref 20–29)
Calcium: 8.9 mg/dL (ref 8.7–10.2)
Chloride: 109 mmol/L — ABNORMAL HIGH (ref 96–106)
Creatinine, Ser: 1 mg/dL (ref 0.57–1.00)
Globulin, Total: 2.2 g/dL (ref 1.5–4.5)
Glucose: 171 mg/dL — ABNORMAL HIGH (ref 70–99)
Potassium: 4.5 mmol/L (ref 3.5–5.2)
Sodium: 144 mmol/L (ref 134–144)
Total Protein: 6.6 g/dL (ref 6.0–8.5)
eGFR: 66 mL/min/{1.73_m2} (ref >59)

## 2023-04-06 MED ORDER — SIMVASTATIN 20 MG PO TABS: 20.0000 mg | ORAL_TABLET | Freq: Every day | ORAL | 3 refills | Status: AC

## 2023-04-14 ENCOUNTER — Other Ambulatory Visit: Payer: Self-pay | Admitting: Physician Assistant

## 2023-04-14 DIAGNOSIS — Z1231 Encounter for screening mammogram for malignant neoplasm of breast: Secondary | ICD-10-CM

## 2023-05-21 ENCOUNTER — Ambulatory Visit
Admission: RE | Admit: 2023-05-21 | Discharge: 2023-05-21 | Disposition: A | Discharge status: Home / Self Care | Payer: BC Managed Care – PPO | Source: Ambulatory Visit | Attending: Physician Assistant | Admitting: Physician Assistant

## 2023-05-21 DIAGNOSIS — Z1231 Encounter for screening mammogram for malignant neoplasm of breast: Secondary | ICD-10-CM

## 2023-06-24 ENCOUNTER — Ambulatory Visit: Payer: BC Managed Care – PPO | Admitting: Obstetrics and Gynecology

## 2023-08-17 NOTE — Progress Notes (Unsigned)
PCP: Ronnald Ramp, MD   No chief complaint on file.   HPI:      [name] is a 57 y.o. G1P1001 whose LMP was No LMP recorded. Patient has had a hysterectomy., presents today for her annual examination.  Her menses are absent due to [facility] in 2009 for AUB and fibroids. No PMB. She does not have vasomotor sx.   Sex activity: not sexually active, contraception - status post hysterectomy. She does not have vaginal dryness.  Last Pap: 05/15/20 Results were: no abnormalities /neg HPV DNA 2019. Still has cx.  Hx of STDs: none  Last mammogram: 05/21/23 Results were: normal--routine follow-up in 12 months There is a FH of breast cancer in her sister and pancreatic cancer in her mother. There is no FH of ovarian cancer. Pt is MyRisk neg 2021 except 4 VUS, IBIS=16.1% The patient does self-breast exams.  Colonoscopy: 8/22 with Dr. Lemar Livings; repeat due after 5 yrs; previous one 2016 with diverticulitis  Tobacco use: The patient denies current or previous tobacco use. Alcohol use: none No drug use Exercise: moderately active  She does get adequate calcium and Vitamin D in her diet.  Labs with PCP.   Past Medical History:  Diagnosis Date   Anemia    BRCA negative 12/2019   MyRisk neg except CDKN2A and 3-MSH3 VUS; IBIS=16.1%   Chronic kidney disease    Diabetes mellitus without complication (HCC)    Diverticulosis    Duodenitis    Family history of pancreatic cancer    Heart murmur    Hypertension    Migraine     Past Surgical History:  Procedure Laterality Date   CHOLECYSTECTOMY     laparscopic-Dr Byrnett   COLONOSCOPY WITH PROPOFOL N/A 04/15/2015   Procedure: COLONOSCOPY WITH PROPOFOL;  Surgeon: Christena Deem, MD;  Location: [facility] ENDOSCOPY;  Service: Endoscopy;  Laterality: N/A;   COLONOSCOPY WITH PROPOFOL N/A 06/13/2021   Procedure: COLONOSCOPY WITH PROPOFOL;  Surgeon: Earline Mayotte, MD;  Location: [facility] ENDOSCOPY;  Service: Endoscopy;  Laterality:  N/A;   ESOPHAGOGASTRODUODENOSCOPY N/A 04/15/2015   Procedure: ESOPHAGOGASTRODUODENOSCOPY (EGD);  Surgeon: Christena Deem, MD;  Location[facility] ENDOSCOPY;  Service: Endoscopy;  Laterality: N/A;   KNEE ARTHROSCOPY W/ SYNOVECTOMY Left    LAPAROSCOPIC SUPRACERVICAL HYSTERECTOMY  2009   fibroids/AUB   TUBAL LIGATION Bilateral     Family History  Problem Relation Age of Onset   Pancreatic cancer Mother 22   Throat cancer Father    Hypertension Father    Hypertension Sister    Diabetes Sister    Breast cancer Sister 71   Hypertension Brother    Diabetes Maternal Aunt    Hypertension Maternal Aunt    Hypertension Maternal Uncle    Diabetes Maternal Uncle     Social History   Socioeconomic History   Marital status: Married    Spouse name: Reuel Boom   Number of children: 1   Years of education: masters   Highest education level: Not on file  Occupational History   Occupation: Facilities manager: [employer]  Tobacco Use   Smoking status: Never   Smokeless tobacco: Never  Vaping Use   Vaping status: Never Used  Substance and Sexual Activity   Alcohol use: No   Drug use: No   Sexual activity: Yes    Partners: Male    Birth control/protection: Surgical, Post-menopausal    Comment: Hysterectomy   Other Topics Concern   Not on  file  Social History Narrative   Has one granddaughter named " [name]" 82 years old (born 2016)   Social Determinants of Health   Financial Resource Strain: Not on file  Food Insecurity: Not on file  Transportation Needs: Not on file  Physical Activity: Not on file  Stress: Not on file  Social Connections: Not on file  Intimate Partner Violence: Not on file     Current Outpatient Medications:    Blood Glucose Monitoring Suppl (ACCU-CHEK AVIVA PLUS) w/Device KIT, Use to check blood sugar fasting daily and as needed, Disp: 1 kit, Rfl: 0   Cholecalciferol (VITAMIN D) 2000 UNITS CAPS, Take 2,000 Units by mouth daily., Disp: , Rfl:     glipiZIDE (GLUCOTROL) 5 MG tablet, Take 1 tablet (5 mg total) by mouth daily before breakfast., Disp: 90 tablet, Rfl: 1   glucose blood (ACCU-CHEK AVIVA PLUS) test strip, Use to check blood sugar fasting daily and as needed, Disp: 100 each, Rfl: 3   hydrocortisone 2.5 % cream, Apply 1 application topically at bedtime., Disp: 30 g, Rfl: 0   Lancets (ACCU-CHEK MULTICLIX) lancets, Use to check blood sugar fasting daily and as needed, Disp: 100 each, Rfl: 3   losartan (COZAAR) 25 MG tablet, Take 1 tablet (25 mg total) by mouth daily., Disp: 90 tablet, Rfl: 3   metFORMIN (GLUCOPHAGE) 1000 MG tablet, Take 1 tablet (1,000 mg total) by mouth 2 (two) times daily., Disp: 180 tablet, Rfl: 3   Multiple Vitamins-Minerals (WOMENS MULTIVITAMIN PLUS PO), Take 1 tablet by mouth daily., Disp: , Rfl:    simvastatin (ZOCOR) 20 MG tablet, Take 1 tablet (20 mg total) by mouth at bedtime., Disp: 90 tablet, Rfl: 3   sitaGLIPtin (JANUVIA) 100 MG tablet, Take 1 tablet (100 mg total) by mouth daily., Disp: 90 tablet, Rfl: 1     ROS:  Review of Systems  Constitutional:  Negative for fatigue, fever and unexpected weight change.  Respiratory:  Negative for cough, shortness of breath and wheezing.   Cardiovascular:  Negative for chest pain, palpitations and leg swelling.  Gastrointestinal:  Negative for blood in stool, constipation, diarrhea, nausea and vomiting.  Endocrine: Negative for cold intolerance, heat intolerance and polyuria.  Genitourinary:  Negative for dyspareunia, dysuria, flank pain, frequency, genital sores, hematuria, menstrual problem, pelvic pain, urgency, vaginal bleeding, vaginal discharge and vaginal pain.  Musculoskeletal:  Positive for arthralgias. Negative for back pain, joint swelling and myalgias.  Skin:  Negative for rash.  Neurological:  Negative for dizziness, syncope, light-headedness, numbness and headaches.  Hematological:  Negative for adenopathy.  Psychiatric/Behavioral:  Negative for  agitation, confusion, sleep disturbance and suicidal ideas. The patient is not nervous/anxious.    BREAST: No symptoms    Objective: There were no vitals taken for this visit.   Physical Exam Constitutional:      Appearance: She is well-developed.  Genitourinary:     Vulva normal.     Genitourinary Comments: UTERUS SURG REM     Right Labia: No rash, tenderness or lesions.    Left Labia: No tenderness, lesions or rash.    No vaginal discharge, erythema or tenderness.      Right Adnexa: not tender and no mass present.    Left Adnexa: not tender and no mass present.    Cervix is not absent.     Uterus is absent.  Breasts:    Right: No mass, nipple discharge, skin change or tenderness.     Left: No mass, nipple discharge, skin change  or tenderness.  Neck:     Thyroid: No thyromegaly.  Cardiovascular:     Rate and Rhythm: Normal rate and regular rhythm.     Heart sounds: Normal heart sounds. No murmur heard. Pulmonary:     Effort: Pulmonary effort is normal.     Breath sounds: Normal breath sounds.  Abdominal:     Palpations: Abdomen is soft.     Tenderness: There is no abdominal tenderness. There is no guarding.  Musculoskeletal:        General: Normal range of motion.     Cervical back: Normal range of motion.  Neurological:     General: No focal deficit present.     Mental Status: She is alert and oriented to person, place, and time.     Cranial Nerves: No cranial nerve deficit.  Skin:    General: Skin is warm and dry.  Psychiatric:        Mood and Affect: Mood normal.        Behavior: Behavior normal.        Thought Content: Thought content normal.        Judgment: Judgment normal.  Vitals reviewed.     Assessment/Plan:  Encounter for annual routine gynecological examination  Encounter for screening mammogram for malignant neoplasm of breast; pt getting mammo today  Family history of breast cancer--pt is MyRisk neg; no further screening options at this  time.           GYN counsel breast self exam, mammography screening, menopause, adequate intake of calcium and vitamin D, diet and exercise    F/U  No follow-ups on file.  Vishruth Seoane B. Alisha Burgo, PA-C 08/17/2023 4:57 PM

## 2023-08-19 ENCOUNTER — Ambulatory Visit (INDEPENDENT_AMBULATORY_CARE_PROVIDER_SITE_OTHER): Payer: BC Managed Care – PPO | Admitting: Obstetrics and Gynecology

## 2023-08-19 ENCOUNTER — Other Ambulatory Visit (HOSPITAL_COMMUNITY)
Admission: RE | Admit: 2023-08-19 | Discharge: 2023-08-19 | Disposition: A | Discharge status: Home / Self Care | Payer: BC Managed Care – PPO | Source: Ambulatory Visit | Attending: Obstetrics and Gynecology | Admitting: Obstetrics and Gynecology

## 2023-08-19 ENCOUNTER — Encounter: Payer: Self-pay | Admitting: Obstetrics and Gynecology

## 2023-08-19 VITALS — BP 129/72 | HR 80 | Ht 66.0 in | Wt 177.0 lb

## 2023-08-19 DIAGNOSIS — Z1151 Encounter for screening for human papillomavirus (HPV): Secondary | ICD-10-CM

## 2023-08-19 DIAGNOSIS — Z01419 Encounter for gynecological examination (general) (routine) without abnormal findings: Secondary | ICD-10-CM | POA: Diagnosis not present

## 2023-08-19 DIAGNOSIS — Z803 Family history of malignant neoplasm of breast: Secondary | ICD-10-CM

## 2023-08-19 DIAGNOSIS — Z124 Encounter for screening for malignant neoplasm of cervix: Secondary | ICD-10-CM | POA: Diagnosis present

## 2023-08-19 DIAGNOSIS — Z1231 Encounter for screening mammogram for malignant neoplasm of breast: Secondary | ICD-10-CM

## 2023-08-19 NOTE — Patient Instructions (Signed)
I value your feedback and you entrusting us with your care. If you get a [facility] patient survey, I would appreciate you taking the time to let us know about your experience today. Thank you! ? ? ?

## 2023-08-25 LAB — CYTOLOGY - PAP
Comment: NEGATIVE
Diagnosis: NEGATIVE
High risk HPV: NEGATIVE

## 2023-08-26 ENCOUNTER — Telehealth: Payer: BC Managed Care – PPO | Admitting: Physician Assistant

## 2023-08-26 DIAGNOSIS — M25521 Pain in right elbow: Secondary | ICD-10-CM

## 2023-08-26 MED ORDER — MELOXICAM 15 MG PO TABS: 15.0000 mg | ORAL_TABLET | Freq: Every day | ORAL | 0 refills | Status: DC

## 2023-08-26 NOTE — Patient Instructions (Signed)
  Jim Like Erion, thank you for joining [name], PA-C for today's virtual visit.  While this provider is not your primary care provider (PCP), if your PCP is located in our provider database this encounter information will be shared with them immediately following your visit.   A [facility] MyChart account gives you access to today's visit and all your visits, tests, and labs performed at [facility] " click here if you don't have a [facility] MyChart account or go to [email or web address]  Consent: (Patient) Austynn Pridmore Mages provided verbal consent for this virtual visit at the beginning of the encounter.  Current Medications:  Current Outpatient Medications:    Blood Glucose Monitoring Suppl (ACCU-CHEK AVIVA PLUS) w/Device KIT, Use to check blood sugar fasting daily and as needed, Disp: 1 kit, Rfl: 0   Cholecalciferol (VITAMIN D) 2000 UNITS CAPS, Take 2,000 Units by mouth daily., Disp: , Rfl:    glipiZIDE (GLUCOTROL) 5 MG tablet, Take 1 tablet (5 mg total) by mouth daily before breakfast., Disp: 90 tablet, Rfl: 1   glucose blood (ACCU-CHEK AVIVA PLUS) test strip, Use to check blood sugar fasting daily and as needed, Disp: 100 each, Rfl: 3   Lancets (ACCU-CHEK MULTICLIX) lancets, Use to check blood sugar fasting daily and as needed, Disp: 100 each, Rfl: 3   losartan (COZAAR) 25 MG tablet, Take 1 tablet (25 mg total) by mouth daily., Disp: 90 tablet, Rfl: 3   metFORMIN (GLUCOPHAGE) 1000 MG tablet, Take 1 tablet (1,000 mg total) by mouth 2 (two) times daily., Disp: 180 tablet, Rfl: 3   Multiple Vitamins-Minerals (WOMENS MULTIVITAMIN PLUS PO), Take 1 tablet by mouth daily., Disp: , Rfl:    simvastatin (ZOCOR) 20 MG tablet, Take 1 tablet (20 mg total) by mouth at bedtime., Disp: 90 tablet, Rfl: 3   sitaGLIPtin (JANUVIA) 100 MG tablet, Take 1 tablet (100 mg total) by mouth daily., Disp: 90 tablet, Rfl: 1   Medications ordered in this encounter:  No orders of the  defined types were placed in this encounter.    *If you need refills on other medications prior to your next appointment, please contact your pharmacy*  Follow-Up: Call back or seek an in-person evaluation if the symptoms worsen or if the condition fails to improve as anticipated.  [facility] [phone]  Other Instructions Please elevate the arm when resting. Alternate Ice and Heat for a few days to see if this adds any relief. If no benefit after 3 days, stop. Take the Meloxicam as directed.  You can take OTC Tylenol later in the day if needed. Consider use of topical Voltaren gel as discussed. If not resolving, or any new or worsening symptoms, please seek an in-person evaluation.    If you have been instructed to have an in-person evaluation today at a local Urgent Care facility, please use the link below. It will take you to a list of all of our available [facility], including address, phone number and hours of operation. Please do not delay care.  [facility]  If you or a family member do not have a primary care provider, use the link below to schedule a visit and establish care. When you choose a [facility] primary care physician or advanced practice provider, you gain a long-term partner in health. Find a Primary Care Provider  Learn more about [facility]'s in-office and virtual care options: [place] [facility] - Get Care Now

## 2023-08-26 NOTE — Progress Notes (Signed)
Virtual Visit Consent   Jacqueline Romero, you are scheduled for a virtual visit with a [facility] provider today. Just as with appointments in the office, your consent must be obtained to participate. Your consent will be active for this visit and any virtual visit you may have with one of our providers in the next 365 days. If you have a MyChart account, a copy of this consent can be sent to you electronically.  As this is a virtual visit, video technology does not allow for your provider to perform a traditional examination. This may limit your provider's ability to fully assess your condition. If your provider identifies any concerns that need to be evaluated in person or the need to arrange testing (such as labs, EKG, etc.), we will make arrangements to do so. Although advances in technology are sophisticated, we cannot ensure that it will always work on either your end or our end. If the connection with a video visit is poor, the visit may have to be switched to a telephone visit. With either a video or telephone visit, we are not always able to ensure that we have a secure connection.  By engaging in this virtual visit, you consent to the provision of healthcare and authorize for your insurance to be billed (if applicable) for the services provided during this visit. Depending on your insurance coverage, you may receive a charge related to this service.  I need to obtain your verbal consent now. Are you willing to proceed with your visit today? [name] has provided verbal consent on 08/26/2023 for a virtual visit (video or telephone). [name], [place]  Date: 08/26/2023 8:15 AM  Virtual Visit via Video Note   I, [name], connected with  Maranatha Grossi Greaves  ([id number], 06-17-1966) on 08/26/23 at  8:00 AM EST by a video-enabled telemedicine application and verified that I am speaking with the correct person using two identifiers.  Location: Patient: Virtual  Visit Location Patient: Home Provider: Virtual Visit Location Provider: Home Office   I discussed the limitations of evaluation and management by telemedicine and the availability of in person appointments. The patient expressed understanding and agreed to proceed.    History of Present Illness: Jacqueline Romero is a 57 y.o. who identifies as a female who was assigned female at birth, and is being seen today for pain of R elbow for the past month, worsening in the past 2 weeks. Pain is worse with activity of the arm and is described as an aching sensation and sometimes with sharp pain moving down the forearm. Denies trauma or injury. 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 Denies any noted swelling, redness or bruising of the area. Endorses normal ROM.   OTC -- nothing as of yet.   HPI: HPI  Problems:  Patient Active Problem List   Diagnosis Date Noted   Family history of pancreatic cancer 12/21/2019   Family history of breast cancer 12/21/2019   Constipation 07/17/2018   Anemia    Hypertension associated with diabetes (HCC)    Epigastric abdominal pain 05/10/2017   Vitamin D deficiency 06/19/2015   Obesity 06/19/2015   Hyperlipidemia associated with type 2 diabetes mellitus (HCC) 06/19/2015   GERD (gastroesophageal reflux disease) 06/19/2015   Diabetes mellitus, type 2 (HCC) 03/25/2015    Allergies:  Allergies  Allergen Reactions   Elemental Sulfur Hives   Other Other (See Comments)   Penicillins Hives and Swelling   Medications:  Current Outpatient Medications:    meloxicam (MOBIC) 15 MG tablet, Take 1 tablet (15 mg total) by mouth daily., Disp: 30 tablet, Rfl: 0   Blood Glucose Monitoring Suppl (ACCU-CHEK AVIVA PLUS) w/Device KIT, Use to check blood sugar fasting daily and as needed, Disp: 1 kit, Rfl: 0   Cholecalciferol (VITAMIN D) 2000 UNITS CAPS, Take 2,000 Units by mouth daily., Disp: , Rfl:    glipiZIDE (GLUCOTROL) 5 MG tablet, Take 1 tablet (5 mg total) by mouth  daily before breakfast., Disp: 90 tablet, Rfl: 1   glucose blood (ACCU-CHEK AVIVA PLUS) test strip, Use to check blood sugar fasting daily and as needed, Disp: 100 each, Rfl: 3   Lancets (ACCU-CHEK MULTICLIX) lancets, Use to check blood sugar fasting daily and as needed, Disp: 100 each, Rfl: 3   losartan (COZAAR) 25 MG tablet, Take 1 tablet (25 mg total) by mouth daily., Disp: 90 tablet, Rfl: 3   metFORMIN (GLUCOPHAGE) 1000 MG tablet, Take 1 tablet (1,000 mg total) by mouth 2 (two) times daily., Disp: 180 tablet, Rfl: 3   Multiple Vitamins-Minerals (WOMENS MULTIVITAMIN PLUS PO), Take 1 tablet by mouth daily., Disp: , Rfl:    simvastatin (ZOCOR) 20 MG tablet, Take 1 tablet (20 mg total) by mouth at bedtime., Disp: 90 tablet, Rfl: 3   sitaGLIPtin (JANUVIA) 100 MG tablet, Take 1 tablet (100 mg total) by mouth daily., Disp: 90 tablet, Rfl: 1  Observations/Objective: Patient is well-developed, well-nourished in no acute distress.  Resting comfortably at home.  Head is normocephalic, atraumatic.  No labored breathing. Speech is clear and coherent with logical content.  Patient is alert and oriented at baseline.  Able to demonstrate full ROM of R arm at elbow.  Assessment and Plan: 1. Right elbow pain  Question OA versus an epicondylitis. More difficult to fully differentiate via video visit alone. Will have her start RICE. Meloxicam per orders. OTC medications reviewed. Follow-up with PCP if not quickly resolving or any new/worsening symptoms despite treatment.   Follow Up Instructions: I discussed the assessment and treatment plan with the patient. The patient was provided an opportunity to ask questions and all were answered. The patient agreed with the plan and demonstrated an understanding of the instructions.  A copy of instructions were sent to the patient via MyChart unless otherwise noted below.   The patient was advised to call back or seek an in-person evaluation if the symptoms worsen  or if the condition fails to improve as anticipated.    [name], PA-C

## 2023-09-20 ENCOUNTER — Emergency Department: Payer: BC Managed Care – PPO

## 2023-09-20 ENCOUNTER — Emergency Department
Admission: EM | Admit: 2023-09-20 | Discharge: 2023-09-20 | Disposition: A | Discharge status: Home / Self Care | Payer: BC Managed Care – PPO | Attending: Emergency Medicine | Admitting: Emergency Medicine

## 2023-09-20 ENCOUNTER — Other Ambulatory Visit: Payer: Self-pay

## 2023-09-20 DIAGNOSIS — I129 Hypertensive chronic kidney disease with stage 1 through stage 4 chronic kidney disease, or unspecified chronic kidney disease: Secondary | ICD-10-CM | POA: Insufficient documentation

## 2023-09-20 DIAGNOSIS — N189 Chronic kidney disease, unspecified: Secondary | ICD-10-CM | POA: Insufficient documentation

## 2023-09-20 DIAGNOSIS — R109 Unspecified abdominal pain: Secondary | ICD-10-CM | POA: Diagnosis present

## 2023-09-20 DIAGNOSIS — E1122 Type 2 diabetes mellitus with diabetic chronic kidney disease: Secondary | ICD-10-CM | POA: Insufficient documentation

## 2023-09-20 DIAGNOSIS — N132 Hydronephrosis with renal and ureteral calculous obstruction: Secondary | ICD-10-CM | POA: Diagnosis not present

## 2023-09-20 DIAGNOSIS — N200 Calculus of kidney: Secondary | ICD-10-CM

## 2023-09-20 LAB — URINALYSIS, ROUTINE W REFLEX MICROSCOPIC
Bilirubin Urine: NEGATIVE
Glucose, UA: NEGATIVE mg/dL
Ketones, ur: NEGATIVE mg/dL
Nitrite: NEGATIVE
Protein, ur: 30 mg/dL — AB
RBC / HPF: >50 RBC/hpf (ref 0–5)
Specific Gravity, Urine: 1.024 (ref 1.005–1.030)
Squamous Epithelial / HPF: >50 /[HPF] (ref 0–5)
pH: 5 (ref 5.0–8.0)

## 2023-09-20 LAB — CBC
HCT: 44.9 % (ref 36.0–46.0)
Hemoglobin: 14.8 g/dL (ref 12.0–15.0)
MCH: 29.6 pg (ref 26.0–34.0)
MCHC: 33 g/dL (ref 30.0–36.0)
MCV: 89.8 fL (ref 80.0–100.0)
Platelets: 214 10*3/uL (ref 150–400)
RBC: 5 MIL/uL (ref 3.87–5.11)
RDW: 12 % (ref 11.5–15.5)
WBC: 3.7 10*3/uL — ABNORMAL LOW (ref 4.0–10.5)
nRBC: 0 % (ref 0.0–0.2)

## 2023-09-20 LAB — HEPATIC FUNCTION PANEL
ALT: 12 U/L (ref 0–44)
AST: 16 U/L (ref 15–41)
Albumin: 4 g/dL (ref 3.5–5.0)
Alkaline Phosphatase: 55 U/L (ref 38–126)
Bilirubin, Direct: <0.1 mg/dL (ref 0.0–0.2)
Total Bilirubin: 0.4 mg/dL (ref <1.2)
Total Protein: 6.7 g/dL (ref 6.5–8.1)

## 2023-09-20 LAB — BASIC METABOLIC PANEL
Anion gap: 9 (ref 5–15)
BUN: 14 mg/dL (ref 6–20)
CO2: 25 mmol/L (ref 22–32)
Calcium: 8.9 mg/dL (ref 8.9–10.3)
Chloride: 107 mmol/L (ref 98–111)
Creatinine, Ser: 1.06 mg/dL — ABNORMAL HIGH (ref 0.44–1.00)
GFR, Estimated: >60 mL/min (ref >60)
Glucose, Bld: 176 mg/dL — ABNORMAL HIGH (ref 70–99)
Potassium: 3.8 mmol/L (ref 3.5–5.1)
Sodium: 141 mmol/L (ref 135–145)

## 2023-09-20 LAB — LIPASE, BLOOD: Lipase: 42 U/L (ref 11–51)

## 2023-09-20 MED ORDER — NAPROXEN 500 MG PO TABS: 500.0000 mg | ORAL_TABLET | Freq: Two times a day (BID) | ORAL | 2 refills | Status: DC

## 2023-09-20 MED ORDER — HYDROCODONE-ACETAMINOPHEN 5-325 MG PO TABS
1.0000 | ORAL_TABLET | Freq: Four times a day (QID) | ORAL | 0 refills | Status: DC | PRN
Start: 2023-09-20 — End: 2023-09-20

## 2023-09-20 MED ORDER — ONDANSETRON 4 MG PO TBDP: 4.0000 mg | ORAL_TABLET | Freq: Three times a day (TID) | ORAL | 0 refills | Status: DC | PRN

## 2023-09-20 MED ORDER — NITROFURANTOIN MONOHYD MACRO 100 MG PO CAPS: 100.0000 mg | ORAL_CAPSULE | Freq: Two times a day (BID) | ORAL | 0 refills | Status: AC

## 2023-09-20 MED ORDER — KETOROLAC TROMETHAMINE 30 MG/ML IJ SOLN
30.0000 mg | Freq: Once | INTRAMUSCULAR | Status: AC
  Administered 2023-09-20: 30 mg via INTRAVENOUS
  Filled 2023-09-20: qty 1

## 2023-09-20 MED ORDER — HYDROCODONE-ACETAMINOPHEN 5-325 MG PO TABS: 1.0000 | ORAL_TABLET | Freq: Four times a day (QID) | ORAL | 0 refills | Status: DC | PRN

## 2023-09-20 MED ORDER — SODIUM CHLORIDE 0.9 % IV BOLUS
500.0000 mL | Freq: Once | INTRAVENOUS | Status: AC
  Administered 2023-09-20: 500 mL via INTRAVENOUS

## 2023-09-20 MED ORDER — ONDANSETRON HCL 4 MG/2ML IJ SOLN
4.0000 mg | Freq: Once | INTRAMUSCULAR | Status: AC
  Administered 2023-09-20: 4 mg via INTRAVENOUS
  Filled 2023-09-20: qty 2

## 2023-09-20 MED ORDER — TAMSULOSIN HCL 0.4 MG PO CAPS: 0.4000 mg | ORAL_CAPSULE | Freq: Every day | ORAL | 0 refills | Status: DC

## 2023-09-20 NOTE — ED Triage Notes (Signed)
Pt comes with c/o right sided flank pain that started this morning. Pt states hx of gallstones. Pt states no urinary symptoms. Pt states sharp pain that radiates around to front.

## 2023-09-20 NOTE — ED Provider Notes (Signed)
[facility] Provider Note    Event Date/Time   First MD Initiated Contact with Patient 09/20/23 [phone]     (approximate)   History   Flank Pain   HPI  Jacqueline Romero is a 57 y.o. female with history of CKD, diabetes, hypertension who presents with complaints of abrupt onset right flank pain which started this morning.  Denies a history of kidney stone, does have a history of cholecystectomy.  Positive nausea, no fevers reported, no dysuria     Physical Exam   Triage Vital Signs: ED Triage Vitals  Encounter Vitals Group     BP 09/20/23 0819 (!) 160/91     Systolic BP Percentile --      Diastolic BP Percentile --      Pulse Rate 09/20/23 0819 85     Resp 09/20/23 0819 19     Temp 09/20/23 0819 98 F (36.7 C)     Temp src --      SpO2 09/20/23 0819 98 %     Weight 09/20/23 0818 77.6 kg (171 lb)     Height 09/20/23 0818 1.676 m (5\' 6" )     Head Circumference --      Peak Flow --      Pain Score 09/20/23 0818 10     Pain Loc --      Pain Education --      Exclude from Growth Chart --     Most recent vital signs: Vitals:   09/20/23 0819 09/20/23 1025  BP: (!) 160/91 (!) 140/88  Pulse: 85 78  Resp: 19 18  Temp: 98 F (36.7 C)   SpO2: 98% 98%     General: Awake, no distress.  CV:  Good peripheral perfusion.  Resp:  Normal effort.  Abd:  No distention.  Mild right CVA tenderness Other:     ED Results / Procedures / Treatments   Labs (all labs ordered are listed, but only abnormal results are displayed) Labs Reviewed  URINALYSIS, ROUTINE W REFLEX MICROSCOPIC - Abnormal; Notable for the following components:      Result Value   Color, Urine YELLOW (*)    APPearance CLOUDY (*)    Hgb urine dipstick LARGE (*)    Protein, ur 30 (*)    Leukocytes,Ua SMALL (*)    Bacteria, UA RARE (*)    All other components within normal limits  CBC - Abnormal; Notable for the following components:   WBC 3.7 (*)    All other components within  normal limits  BASIC METABOLIC PANEL - Abnormal; Notable for the following components:   Glucose, Bld 176 (*)    Creatinine, Ser 1.06 (*)    All other components within normal limits  HEPATIC FUNCTION PANEL  LIPASE, BLOOD     EKG     RADIOLOGY CT renal stone study pending    PROCEDURES:  Critical Care performed:   Procedures   MEDICATIONS ORDERED IN ED: Medications  ketorolac (TORADOL) 30 MG/ML injection 30 mg (30 mg Intravenous Given 09/20/23 0857)  ondansetron (ZOFRAN) injection 4 mg (4 mg Intravenous Given 09/20/23 0857)  sodium chloride 0.9 % bolus 500 mL (0 mLs Intravenous Stopped 09/20/23 1025)     IMPRESSION / MDM / ASSESSMENT AND PLAN / ED COURSE  I reviewed the triage vital signs and the nursing notes. Patient's presentation is most consistent with acute presentation with potential threat to life or bodily function.  Patient presents with right flank pain that  is severe, she is tearful and in obvious discomfort.  Will treat with IV Toradol.  Differential includes ureterolithiasis, UTI, pyelonephritis, less likely appendicitis  Analgesics, IV Zofran, IV fluids, pending CT renal stone study  Lab work reviewed urinalysis with greater than 50 squamous epis large hemoglobin noted suspicious for ureterolithiasis.  CT scan is consistent with 3 mm ureterolithiasis, no evidence of infection/UTI, no dysuria.  She is diabetic.  Is improved after treatment, no indication for admission at this time, prep for discharge with analgesics, Zofran, will cover with antibiotics as well      FINAL CLINICAL IMPRESSION(S) / ED DIAGNOSES   Final diagnoses:  Kidney stone     Rx / DC Orders   ED Discharge Orders          Ordered    naproxen (NAPROSYN) 500 MG tablet  2 times daily with meals        09/20/23 1003    HYDROcodone-acetaminophen (NORCO/VICODIN) 5-325 MG tablet  Every 6 hours PRN,   Status:  Discontinued        09/20/23 1003    tamsulosin (FLOMAX) 0.4 MG CAPS  capsule  Daily        09/20/23 1003    ondansetron (ZOFRAN-ODT) 4 MG disintegrating tablet  Every 8 hours PRN        09/20/23 1003    HYDROcodone-acetaminophen (NORCO/VICODIN) 5-325 MG tablet  Every 6 hours PRN        09/20/23 1003    nitrofurantoin, macrocrystal-monohydrate, (MACROBID) 100 MG capsule  2 times daily        09/20/23 1050             Note:  This document was prepared using Dragon voice recognition software and may include unintentional dictation errors.   Jene Every, MD 09/20/23 1050

## 2023-09-23 ENCOUNTER — Ambulatory Visit: Payer: BC Managed Care – PPO | Admitting: Urology

## 2023-09-23 ENCOUNTER — Encounter: Payer: Self-pay | Admitting: Urology

## 2023-09-23 VITALS — BP 144/75 | HR 87 | Ht 66.0 in | Wt 171.0 lb

## 2023-09-23 DIAGNOSIS — N201 Calculus of ureter: Secondary | ICD-10-CM | POA: Diagnosis not present

## 2023-09-23 DIAGNOSIS — N200 Calculus of kidney: Secondary | ICD-10-CM

## 2023-09-23 NOTE — Progress Notes (Signed)
09/23/23 4:04 PM   Jacqueline Romero Apr 16, 1966 [id number]  CC: Right proximal ureteral stone  HPI: 57 year old female with no prior history of stones who presented to the ER on 09/20/2023 with acute onset of right-sided flank pain.  CT at that time showed a 3 mm right proximal ureteral stone with mild hydronephrosis, small nonobstructing upper pole calculi.  Urinalysis was contaminated with no definite evidence of infection and lab work in the ER was benign.  Her pain has been well-controlled with NSAIDs, took 1 dose of narcotic 2 days ago.  Denies any fevers, chills, or UTI symptoms.   PMH: Past Medical History:  Diagnosis Date   Anemia    BRCA negative 12/2019   MyRisk neg except CDKN2A and 3-MSH3 VUS; IBIS=16.1%   Chronic kidney disease    Diabetes mellitus without complication (HCC)    Diverticulosis    Duodenitis    Family history of pancreatic cancer    Heart murmur    Hypertension    Kidney stone    Migraine     Surgical History: Past Surgical History:  Procedure Laterality Date   CHOLECYSTECTOMY     laparscopic-Dr Byrnett   COLONOSCOPY WITH PROPOFOL N/A 04/15/2015   Procedure: COLONOSCOPY WITH PROPOFOL;  Surgeon: Christena Deem, MD;  Location: [facility] ENDOSCOPY;  Service: Endoscopy;  Laterality: N/A;   COLONOSCOPY WITH PROPOFOL N/A 06/13/2021   Procedure: COLONOSCOPY WITH PROPOFOL;  Surgeon: Earline Mayotte, MD;  Location: [facility] ENDOSCOPY;  Service: Endoscopy;  Laterality: N/A;   ESOPHAGOGASTRODUODENOSCOPY N/A 04/15/2015   Procedure: ESOPHAGOGASTRODUODENOSCOPY (EGD);  Surgeon: Christena Deem, MD;  Location: [facility] ENDOSCOPY;  Service: Endoscopy;  Laterality: N/A;   KNEE ARTHROSCOPY W/ SYNOVECTOMY Left    LAPAROSCOPIC SUPRACERVICAL HYSTERECTOMY  2009   fibroids/AUB   TUBAL LIGATION Bilateral      Family History: Family History  Problem Relation Age of Onset   Pancreatic cancer Mother 75   Throat cancer Father    Hypertension Father    Hypertension  Sister    Diabetes Sister    Breast cancer Sister 71   Hypertension Brother    Diabetes Maternal Aunt    Hypertension Maternal Aunt    Hypertension Maternal Uncle    Diabetes Maternal Uncle     Social History:  reports that she has never smoked. She has never used smokeless tobacco. She reports that she does not drink alcohol and does not use drugs.  Physical Exam: BP (!) 144/75 (BP Location: Left Arm, Patient Position: Sitting, Cuff Size: Large)   Pulse 87   Ht 5\' 6"  (1.676 m)   Wt 171 lb (77.6 kg)   BMI 27.60 kg/m    Constitutional:  Alert and oriented, No acute distress. Cardiovascular: No clubbing, cyanosis, or edema. Respiratory: Normal respiratory effort, no increased work of breathing. GI: Abdomen is soft, nontender, nondistended, no abdominal masses  Laboratory Data: Reviewed in epic  Pertinent Imaging: I have personally viewed and interpreted the CT scan dated 09/20/2023 showing a 3 mm right proximal ureteral stone, small nonobstructive right upper pole stones.  Assessment & Plan:   57 year old female with 3 mm right proximal ureteral stone, no clinical or laboratory evidence of infection, pain currently well-controlled.  We discussed various treatment options for urolithiasis including observation with or without medical expulsive therapy, shockwave lithotripsy (SWL), ureteroscopy and laser lithotripsy with stent placement, and percutaneous nephrolithotomy.We discussed that management is based on stone size, location, density, patient co-morbidities, and patient preference. Stones <26mm in size have a >  80% spontaneous passage rate. Data surrounding the use of tamsulosin for medical expulsive therapy is controversial, but meta analyses suggests it is most efficacious for distal stones between 5-14mm in size. Possible side effects include dizziness/lightheadedness, and retrograde ejaculation.SWL has a lower stone free rate in a single procedure, but also a lower complication  rate compared to ureteroscopy and avoids a stent and associated stent related symptoms. Possible complications include renal hematoma, steinstrasse, and need for additional treatment.Ureteroscopy with laser lithotripsy and stent placement has a higher stone free rate than SWL in a single procedure, however increased complication rate including possible infection, ureteral injury, bleeding, and stent related morbidity. Common stent related symptoms include dysuria, urgency/frequency, and flank pain.  She would like to continue medical expulsive therapy, risks and benefits and return precautions discussed.  RTC 2 weeks symptom check  Legrand Rams, MD 09/23/2023  [facility] [address] [place], [place] [address] [phone]

## 2023-09-23 NOTE — Patient Instructions (Signed)
 Kidney Stones Kidney stones are rock-like masses that form inside of the kidneys. Kidneys are organs that make pee (urine). A kidney stone may move into other parts of the urinary tract, including: The tubes that connect the kidneys to the bladder (ureters). The bladder. The tube that carries urine out of the body (urethra). Kidney stones can cause very bad pain and can block the flow of pee. The stone usually leaves your body through your pee. A doctor may need to take out the stone. What are the causes? Kidney stones may be caused by: Too much calcium in the body. This may be caused by too much parathyroid hormone in the blood. Uric acid crystals in the bladder. The body makes uric acid when you eat certain foods. Narrowing of one or both of the ureters. A kidney blockage that you were born with. Past surgery on the kidney or the ureters. What increases the risk? You are more likely to develop this condition if: You have had a kidney stone in the past. Other people in your family have had kidney stones. You do not drink enough water. You eat a diet that is high in protein, salt (sodium), or sugar. You are very overweight (obese). What are the signs or symptoms? Symptoms of a kidney stone may include: Pain in the side of the belly, right below the ribs. Pain usually spreads to the groin. Needing to pee often or right away. Pain when peeing. Blood in your pee. Feeling like you may vomit (nauseous). Vomiting. Fever and chills. How is this treated? Treatment depends on the size, location, and makeup of the kidney stones. The stones will often pass out of the body when you pee. You may need to: Drink more fluid to help pass the stone. In some cases, you may be given fluids through an IV tube at the hospital. Take medicine for pain. Change your diet to help keep kidney stones from coming back. Sometimes, you may need: A procedure to break up kidney stones using a beam of light (laser)  or shock waves. Surgery to remove the kidney stones. Follow these instructions at home: Medicines Take over-the-counter and prescription medicines only as told by your doctor. Ask your doctor if the medicine prescribed to you requires you to avoid driving or using machinery. Eating and drinking Drink enough fluid to keep your pee pale yellow. You may be told to drink at least 8-10 glasses of water each day. This will help you pass the stone. If told by your doctor, change your diet. You may be told to: Limit how much salt you eat. Eat more fruits and vegetables. Limit how much meat, poultry, fish, and eggs you eat. Follow instructions from your doctor about what you may eat and drink. General instructions Collect pee samples as told by your doctor. You may need to collect a pee sample: 24 hours after a stone comes out. 8-12 weeks after a stone comes out, and every 6-12 months after that. Strain your pee every time you pee. Use the strainer that your doctor recommends. Do not throw out the stone. Keep it so that it can be tested by your doctor. Keep all follow-up visits. You may need X-rays and ultrasounds to make sure the stone has come out. How is this prevented? To prevent another kidney stone: Drink enough fluid to keep your pee pale yellow. This is the best way to prevent kidney stones. Eat healthy foods. Avoid certain foods as told by your doctor. You  may be told to eat less protein. Stay at a healthy weight. Where to find more information National Kidney Foundation (NKF): kidney.org [facility]): urologyhealth.org Contact a doctor if: You have pain that gets worse or does not get better with medicine. Get help right away if: You have a fever or chills. You get very bad pain. You get new pain in your belly. You faint. You cannot pee. This information is not intended to replace advice given to you by your health care provider. Make sure you discuss any  questions you have with your health care provider. Document Revised: 05/29/2022 Document Reviewed: 05/29/2022 Elsevier Patient Education  2024 [employer].

## 2023-10-05 ENCOUNTER — Ambulatory Visit: Payer: BC Managed Care – PPO | Admitting: Physician Assistant

## 2023-10-08 ENCOUNTER — Ambulatory Visit: Payer: BC Managed Care – PPO | Admitting: Physician Assistant

## 2023-10-08 VITALS — BP 125/76 | HR 93 | Ht 66.0 in | Wt 171.0 lb

## 2023-10-08 DIAGNOSIS — N201 Calculus of ureter: Secondary | ICD-10-CM | POA: Diagnosis not present

## 2023-10-08 LAB — URINALYSIS, COMPLETE
Bilirubin, UA: NEGATIVE
Ketones, UA: NEGATIVE
Leukocytes,UA: NEGATIVE
Nitrite, UA: NEGATIVE
Specific Gravity, UA: 1.025 (ref 1.005–1.030)
Urobilinogen, Ur: 0.2 mg/dL (ref 0.2–1.0)
pH, UA: 6.5 (ref 5.0–7.5)

## 2023-10-08 LAB — MICROSCOPIC EXAMINATION
Epithelial Cells (non renal): >10 /[HPF] — AB (ref 0–10)
RBC, Urine: >30 /[HPF] — AB (ref 0–2)

## 2023-10-08 LAB — HM DIABETES EYE EXAM

## 2023-10-08 MED ORDER — HYDROCODONE-ACETAMINOPHEN 5-325 MG PO TABS: 1.0000 | ORAL_TABLET | Freq: Four times a day (QID) | ORAL | 0 refills | Status: DC | PRN

## 2023-10-08 MED ORDER — TAMSULOSIN HCL 0.4 MG PO CAPS
0.4000 mg | ORAL_CAPSULE | Freq: Every day | ORAL | 0 refills | Status: DC
Start: 2023-10-08 — End: 2024-07-11

## 2023-10-08 NOTE — Progress Notes (Unsigned)
10/08/2023 3:18 PM   Jacqueline Romero [birth year] [id number]  CC: Chief Complaint  Patient presents with   Nephrolithiasis   HPI: Jacqueline Romero is a 57 y.o. female who presents for follow-up of a 3 mm proximal right ureteral stone.    Today she reports she has not seen a stone pass, and she continues to have some RLQ aching and nausea.  She had some dysuria a few days ago but denies fevers.  Overall she is rather comfortable.  In-office UA today positive for trace glucose, 3+ blood, 1+ protein; urine microscopy with >30 RBCs/HPF, >10 epithelial cells/hpf, and moderate bacteria.   PMH: Past Medical History:  Diagnosis Date   Anemia    BRCA negative 12/2019   MyRisk neg except CDKN2A and 3-MSH3 VUS; IBIS=16.1%   Chronic kidney disease    Diabetes mellitus without complication (HCC)    Diverticulosis    Duodenitis    Family history of pancreatic cancer    Heart murmur    Hypertension    Kidney stone    Migraine     Surgical History: Past Surgical History:  Procedure Laterality Date   CHOLECYSTECTOMY     laparscopic-Dr Byrnett   COLONOSCOPY WITH PROPOFOL N/A 04/15/2015   Procedure: COLONOSCOPY WITH PROPOFOL;  Surgeon: Christena Deem, MD;  Location: [facility] ENDOSCOPY;  Service: Endoscopy;  Laterality: N/A;   COLONOSCOPY WITH PROPOFOL N/A 06/13/2021   Procedure: COLONOSCOPY WITH PROPOFOL;  Surgeon: Earline Mayotte, MD;  Location: [facility] ENDOSCOPY;  Service: Endoscopy;  Laterality: N/A;   ESOPHAGOGASTRODUODENOSCOPY N/A 04/15/2015   Procedure: ESOPHAGOGASTRODUODENOSCOPY (EGD);  Surgeon: Christena Deem, MD;  Location: [facility] ENDOSCOPY;  Service: Endoscopy;  Laterality: N/A;   KNEE ARTHROSCOPY W/ SYNOVECTOMY Left    LAPAROSCOPIC SUPRACERVICAL HYSTERECTOMY  2009   fibroids/AUB   TUBAL LIGATION Bilateral     Home Medications:  Allergies as of 10/08/2023       Reactions   Elemental Sulfur Hives   Other Other (See Comments)   Penicillins Hives, Swelling         Medication List        Accurate as of October 08, 2023  3:18 PM. If you have any questions, ask your nurse or doctor.          Accu-Chek Aviva Plus test strip Generic drug: glucose blood Use to check blood sugar fasting daily and as needed   Accu-Chek Aviva Plus w/Device Kit Use to check blood sugar fasting daily and as needed   accu-chek multiclix lancets Use to check blood sugar fasting daily and as needed   glipiZIDE 5 MG tablet Commonly known as: GLUCOTROL Take 1 tablet (5 mg total) by mouth daily before breakfast.   HYDROcodone-acetaminophen 5-325 MG tablet Commonly known as: NORCO/VICODIN Take 1 tablet by mouth every 6 (six) hours as needed for severe pain (pain score 7-10).   losartan 25 MG tablet Commonly known as: COZAAR Take 1 tablet (25 mg total) by mouth daily.   meloxicam 15 MG tablet Commonly known as: MOBIC Take 1 tablet (15 mg total) by mouth daily.   metFORMIN 1000 MG tablet Commonly known as: GLUCOPHAGE Take 1 tablet (1,000 mg total) by mouth 2 (two) times daily.   naproxen 500 MG tablet Commonly known as: Naprosyn Take 1 tablet (500 mg total) by mouth 2 (two) times daily with a meal.   ondansetron 4 MG disintegrating tablet Commonly known as: ZOFRAN-ODT Take 1 tablet (4 mg total) by mouth every 8 (eight) hours as  needed for nausea or vomiting.   simvastatin 20 MG tablet Commonly known as: ZOCOR Take 1 tablet (20 mg total) by mouth at bedtime.   sitaGLIPtin 100 MG tablet Commonly known as: Januvia Take 1 tablet (100 mg total) by mouth daily.   tamsulosin 0.4 MG Caps capsule Commonly known as: Flomax Take 1 capsule (0.4 mg total) by mouth daily.   Vitamin D 50 MCG (2000 UT) Caps Take 2,000 Units by mouth daily.   WOMENS MULTIVITAMIN PLUS PO Take 1 tablet by mouth daily.        Allergies:  Allergies  Allergen Reactions   Elemental Sulfur Hives   Other Other (See Comments)   Penicillins Hives and Swelling    Family  History: Family History  Problem Relation Age of Onset   Pancreatic cancer Mother 11   Throat cancer Father    Hypertension Father    Hypertension Sister    Diabetes Sister    Breast cancer Sister 68   Hypertension Brother    Diabetes Maternal Aunt    Hypertension Maternal Aunt    Hypertension Maternal Uncle    Diabetes Maternal Uncle     Social History:   reports that she has never smoked. She has never used smokeless tobacco. She reports that she does not drink alcohol and does not use drugs.  Physical Exam: BP 125/76   Pulse 93   Ht 5\' 6"  (1.676 m)   Wt 171 lb (77.6 kg)   BMI 27.60 kg/m   Constitutional:  Alert and oriented, no acute distress, nontoxic appearing HEENT: [place], AT Cardiovascular: No clubbing, cyanosis, or edema Respiratory: Normal respiratory effort, no increased work of breathing Skin: No rashes, bruises or suspicious lesions Neurologic: Grossly intact, no focal deficits, moving all 4 extremities Psychiatric: Normal mood and affect  Laboratory Data: Results for orders placed or performed during the hospital encounter of 09/20/23  Urinalysis, Routine w reflex microscopic -Urine, Random   Collection Time: 09/20/23  8:19 AM  Result Value Ref Range   Color, Urine YELLOW (A) YELLOW   APPearance CLOUDY (A) CLEAR   Specific Gravity, Urine 1.024 1.005 - 1.030   pH 5.0 5.0 - 8.0   Glucose, UA NEGATIVE NEGATIVE mg/dL   Hgb urine dipstick LARGE (A) NEGATIVE   Bilirubin Urine NEGATIVE NEGATIVE   Ketones, ur NEGATIVE NEGATIVE mg/dL   Protein, ur 30 (A) NEGATIVE mg/dL   Nitrite NEGATIVE NEGATIVE   Leukocytes,Ua SMALL (A) NEGATIVE   RBC / HPF >50 0 - 5 RBC/hpf   WBC, UA 11-20 0 - 5 WBC/hpf   Bacteria, UA RARE (A) NONE SEEN   Squamous Epithelial / HPF >50 0 - 5 /HPF   Mucus PRESENT   CBC   Collection Time: 09/20/23  8:19 AM  Result Value Ref Range   WBC 3.7 (L) 4.0 - 10.5 K/uL   RBC 5.00 3.87 - 5.11 MIL/uL   Hemoglobin 14.8 12.0 - 15.0 g/dL   HCT 21.3 08.6  - 57.8 %   MCV 89.8 80.0 - 100.0 fL   MCH 29.6 26.0 - 34.0 pg   MCHC 33.0 30.0 - 36.0 g/dL   RDW 46.9 62.9 - 52.8 %   Platelets 214 150 - 400 K/uL   nRBC 0.0 0.0 - 0.2 %  Basic metabolic panel   Collection Time: 09/20/23  9:00 AM  Result Value Ref Range   Sodium 141 135 - 145 mmol/L   Potassium 3.8 3.5 - 5.1 mmol/L   Chloride 107 98 -  111 mmol/L   CO2 25 22 - 32 mmol/L   Glucose, Bld 176 (H) 70 - 99 mg/dL   BUN 14 6 - 20 mg/dL   Creatinine, Ser 1.61 (H) 0.44 - 1.00 mg/dL   Calcium 8.9 8.9 - 09.6 mg/dL   GFR, Estimated >04 >54 mL/min   Anion gap 9 5 - 15  Hepatic function panel   Collection Time: 09/20/23  9:00 AM  Result Value Ref Range   Total Protein 6.7 6.5 - 8.1 g/dL   Albumin 4.0 3.5 - 5.0 g/dL   AST 16 15 - 41 U/L   ALT 12 0 - 44 U/L   Alkaline Phosphatase 55 38 - 126 U/L   Total Bilirubin 0.4 <1.2 mg/dL   Bilirubin, Direct <0.9 0.0 - 0.2 mg/dL   Indirect Bilirubin NOT CALCULATED 0.3 - 0.9 mg/dL  Lipase, blood   Collection Time: 09/20/23  9:00 AM  Result Value Ref Range   Lipase 42 11 - 51 U/L   Assessment & Plan:   1. Right ureteral stone (Primary) Symptoms and UA consistent with persistent stone episode.  Given overall comfort and low suspicion for UTI, I recommended continued trial of passage.  We discussed proceeding with definitive stone management, but she declined, which is reasonable.  Restart Flomax, push fluids, and take Norco and Zofran for symptom control.  Will see her back in 3 weeks for symptom recheck and UA.  We discussed return precautions including fever, uncontrolled pain, and uncontrolled nausea/vomiting. - Urinalysis, Complete - tamsulosin (FLOMAX) 0.4 MG CAPS capsule; Take 1 capsule (0.4 mg total) by mouth daily.  Dispense: 30 capsule; Refill: 0 - HYDROcodone-acetaminophen (NORCO/VICODIN) 5-325 MG tablet; Take 1 tablet by mouth every 6 (six) hours as needed for severe pain (pain score 7-10).  Dispense: 16 tablet; Refill: 0   Return in about 3  weeks (around 10/29/2023) for Stone f/u with UA.  Carman Ching, PA-C  [facility] [place] [address] [place], [place] [address] [phone]

## 2023-10-08 NOTE — Patient Instructions (Addendum)
For the next 3 weeks, please do the following: -Stay well hydrated -Take Flomax 0.4mg  daily -Treat any pain with Advil (ibuprofen), Tylenol (acetaminophen), or Norco -Treat any nausea with Zofran  I will plan to see you back in clinic in 3 weeks to see if you have passed your stone.  Please call our office immediately or go to the Emergency Department if you develop any of the following: -Fever/chills -Nausea and/or vomiting uncontrollable with Zofran -Pain uncontrollable with Norco

## 2023-10-22 ENCOUNTER — Ambulatory Visit: Payer: BC Managed Care – PPO | Admitting: Physician Assistant

## 2023-11-04 ENCOUNTER — Ambulatory Visit: Payer: BC Managed Care – PPO | Admitting: Physician Assistant

## 2023-11-10 ENCOUNTER — Encounter: Payer: Self-pay | Admitting: Physician Assistant

## 2023-11-10 ENCOUNTER — Ambulatory Visit: Payer: 59 | Admitting: Physician Assistant

## 2023-11-10 ENCOUNTER — Ambulatory Visit
Admission: RE | Admit: 2023-11-10 | Discharge: 2023-11-10 | Disposition: A | Discharge status: Home / Self Care | Payer: 59 | Source: Ambulatory Visit | Attending: Physician Assistant | Admitting: Physician Assistant

## 2023-11-10 VITALS — BP 128/75 | HR 76 | Ht 66.0 in | Wt 178.0 lb

## 2023-11-10 DIAGNOSIS — N201 Calculus of ureter: Secondary | ICD-10-CM | POA: Insufficient documentation

## 2023-11-10 LAB — URINALYSIS, COMPLETE
Bilirubin, UA: NEGATIVE
Glucose, UA: NEGATIVE
Ketones, UA: NEGATIVE
Nitrite, UA: NEGATIVE
Protein,UA: NEGATIVE
RBC, UA: NEGATIVE
Specific Gravity, UA: 1.025 (ref 1.005–1.030)
Urobilinogen, Ur: 1 mg/dL (ref 0.2–1.0)
pH, UA: 6 (ref 5.0–7.5)

## 2023-11-10 LAB — MICROSCOPIC EXAMINATION: Epithelial Cells (non renal): >10 /[HPF] — AB (ref 0–10)

## 2023-11-10 NOTE — Progress Notes (Signed)
Great news, she passed her stone! She still has two small nonobstructing stones in her right kidney; I don't recommend treating these since she'd likely pass them on their own if they ever moved. It looks like she has some bulging discs in her lumbar spine, which could certainly account for her recent back pain. She should follow up with her PCP for these. She may follow up with [place] as needed or annually with [facility] prior for stone monitoring, per her preference.

## 2023-11-10 NOTE — Progress Notes (Signed)
11/10/2023 2:03 PM   Jacqueline Romero [birth year] [id number]  CC: Chief Complaint  Patient presents with   Nephrolithiasis   HPI: Jacqueline Romero is a 58 y.o. female who presents today for follow-up of a 3 mm proximal right ureteral stone.  I saw her in clinic most recently 1 month ago, at which point she was having persistent RLQ aching and nausea.  We elected to continue with a trial of passage.  Today she reports she never saw stone pass and her nausea has resolved.  She had an episode of severe right-sided low back pain last week that she thought was musculoskeletal because she had difficulty walking associated with it, but she admits this could've been stone pain.  On review of prior CT scan, stone is low density measuring <400HU.  She had 2 other nonobstructing right renal stones at the time, no left renal stones visualized.  In-office UA today positive for trace leukocytes; urine microscopy with 6-10 WBCs/HPF, >10 epithelial cells/hpf, and moderate bacteria.  PMH: Past Medical History:  Diagnosis Date   Anemia    BRCA negative 12/2019   MyRisk neg except CDKN2A and 3-MSH3 VUS; IBIS=16.1%   Chronic kidney disease    Diabetes mellitus without complication (HCC)    Diverticulosis    Duodenitis    Family history of pancreatic cancer    Heart murmur    Hypertension    Kidney stone    Migraine     Surgical History: Past Surgical History:  Procedure Laterality Date   CHOLECYSTECTOMY     laparscopic-Dr Byrnett   COLONOSCOPY WITH PROPOFOL N/A 04/15/2015   Procedure: COLONOSCOPY WITH PROPOFOL;  Surgeon: Christena Deem, MD;  Location: [facility] ENDOSCOPY;  Service: Endoscopy;  Laterality: N/A;   COLONOSCOPY WITH PROPOFOL N/A 06/13/2021   Procedure: COLONOSCOPY WITH PROPOFOL;  Surgeon: Earline Mayotte, MD;  Location: [facility] ENDOSCOPY;  Service: Endoscopy;  Laterality: N/A;   ESOPHAGOGASTRODUODENOSCOPY N/A 04/15/2015   Procedure: ESOPHAGOGASTRODUODENOSCOPY (EGD);   Surgeon: Christena Deem, MD;  Location: [facility] ENDOSCOPY;  Service: Endoscopy;  Laterality: N/A;   KNEE ARTHROSCOPY W/ SYNOVECTOMY Left    LAPAROSCOPIC SUPRACERVICAL HYSTERECTOMY  2009   fibroids/AUB   TUBAL LIGATION Bilateral     Home Medications:  Allergies as of 11/10/2023       Reactions   Elemental Sulfur Hives   Other Other (See Comments)   Penicillins Hives, Swelling        Medication List        Accurate as of November 10, 2023  2:03 PM. If you have any questions, ask your nurse or doctor.          Accu-Chek Aviva Plus test strip Generic drug: glucose blood Use to check blood sugar fasting daily and as needed   Accu-Chek Aviva Plus w/Device Kit Use to check blood sugar fasting daily and as needed   accu-chek multiclix lancets Use to check blood sugar fasting daily and as needed   glipiZIDE 5 MG tablet Commonly known as: GLUCOTROL Take 1 tablet (5 mg total) by mouth daily before breakfast.   HYDROcodone-acetaminophen 5-325 MG tablet Commonly known as: NORCO/VICODIN Take 1 tablet by mouth every 6 (six) hours as needed for severe pain (pain score 7-10).   losartan 25 MG tablet Commonly known as: COZAAR Take 1 tablet (25 mg total) by mouth daily.   meloxicam 15 MG tablet Commonly known as: MOBIC Take 1 tablet (15 mg total) by mouth daily.   metFORMIN 1000 MG tablet  Commonly known as: GLUCOPHAGE Take 1 tablet (1,000 mg total) by mouth 2 (two) times daily.   naproxen 500 MG tablet Commonly known as: Naprosyn Take 1 tablet (500 mg total) by mouth 2 (two) times daily with a meal.   ondansetron 4 MG disintegrating tablet Commonly known as: ZOFRAN-ODT Take 1 tablet (4 mg total) by mouth every 8 (eight) hours as needed for nausea or vomiting.   simvastatin 20 MG tablet Commonly known as: ZOCOR Take 1 tablet (20 mg total) by mouth at bedtime.   sitaGLIPtin 100 MG tablet Commonly known as: Januvia Take 1 tablet (100 mg total) by mouth daily.    tamsulosin 0.4 MG Caps capsule Commonly known as: Flomax Take 1 capsule (0.4 mg total) by mouth daily.   Vitamin D 50 MCG (2000 UT) Caps Take 2,000 Units by mouth daily.   WOMENS MULTIVITAMIN PLUS PO Take 1 tablet by mouth daily.        Allergies:  Allergies  Allergen Reactions   Elemental Sulfur Hives   Other Other (See Comments)   Penicillins Hives and Swelling    Family History: Family History  Problem Relation Age of Onset   Pancreatic cancer Mother 64   Throat cancer Father    Hypertension Father    Hypertension Sister    Diabetes Sister    Breast cancer Sister 75   Hypertension Brother    Diabetes Maternal Aunt    Hypertension Maternal Aunt    Hypertension Maternal Uncle    Diabetes Maternal Uncle     Social History:   reports that she has never smoked. She has never used smokeless tobacco. She reports that she does not drink alcohol and does not use drugs.  Physical Exam: BP 128/75   Pulse 76   Ht 5\' 6"  (1.676 m)   Wt 178 lb (80.7 kg)   BMI 28.73 kg/m   Constitutional:  Alert and oriented, no acute distress, nontoxic appearing HEENT: [place], AT Cardiovascular: No clubbing, cyanosis, or edema Respiratory: Normal respiratory effort, no increased work of breathing Skin: No rashes, bruises or suspicious lesions Neurologic: Grossly intact, no focal deficits, moving all 4 extremities Psychiatric: Normal mood and affect  Laboratory Data: Results for orders placed or performed in visit on 11/10/23  Microscopic Examination   Collection Time: 11/10/23  1:31 PM   Urine  Result Value Ref Range   WBC, UA 6-10 (A) 0 - 5 /hpf   RBC, Urine 0-2 0 - 2 /hpf   Epithelial Cells (non renal) >10 (A) 0 - 10 /hpf   Bacteria, UA Moderate (A) None seen/Few  Urinalysis, Complete   Collection Time: 11/10/23  1:31 PM  Result Value Ref Range   Specific Gravity, UA 1.025 1.005 - 1.030   pH, UA 6.0 5.0 - 7.5   Color, UA Yellow Yellow   Appearance Ur Clear Clear    Leukocytes,UA Trace (A) Negative   Protein,UA Negative Negative/Trace   Glucose, UA Negative Negative   Ketones, UA Negative Negative   RBC, UA Negative Negative   Bilirubin, UA Negative Negative   Urobilinogen, Ur 1.0 0.2 - 1.0 mg/dL   Nitrite, UA Negative Negative   Microscopic Examination See below:    Assessment & Plan:   1. Right ureteral stone (Primary) She never saw a stone pass, but her microscopic hematuria and nausea have resolved.  I am concerned that her severe right low back pain could represent stone pain, so I recommended repeat imaging today.  If she does have  a retained stone, she has now failed to pass it for over 6 weeks and I would recommend pursuing definitive management.  Because of small stone size and low density, I think her stone is likely to be radiolucent.  Additionally, with her other previously visualized right renal stones, she stated she would be more interested in pursuing ureteroscopy to clear her right-sided stone burden than shockwave lithotripsy to treat the single ureteral stone, so will defer KUB and get a definitive CT stone study at this time to reassess stone positioning.  Will send UA for culture today in case she ends up needing ureteroscopy, though her UA does appear contaminated. - Urinalysis, Complete - CT RENAL STONE STUDY; Future - CULTURE, URINE COMPREHENSIVE   Return for Will call with results.  Carman Ching, PA-C  [facility] [place] [address], [address] [place], [place] [address] [phone]

## 2023-11-11 ENCOUNTER — Ambulatory Visit: Payer: BC Managed Care – PPO | Admitting: Physician Assistant

## 2023-11-13 LAB — CULTURE, URINE COMPREHENSIVE

## 2024-03-21 ENCOUNTER — Other Ambulatory Visit: Payer: Self-pay | Admitting: Physician Assistant

## 2024-03-21 DIAGNOSIS — E119 Type 2 diabetes mellitus without complications: Secondary | ICD-10-CM

## 2024-03-22 ENCOUNTER — Telehealth: Payer: Self-pay | Admitting: Family Medicine

## 2024-03-22 NOTE — Telephone Encounter (Signed)
 [facility] faxed refill request for the following medications:   metFORMIN  (GLUCOPHAGE ) 1000 MG tablet    Please advise

## 2024-03-23 ENCOUNTER — Other Ambulatory Visit: Payer: Self-pay

## 2024-03-23 DIAGNOSIS — E119 Type 2 diabetes mellitus without complications: Secondary | ICD-10-CM

## 2024-03-23 MED ORDER — METFORMIN HCL 1000 MG PO TABS: 1000.0000 mg | ORAL_TABLET | Freq: Two times a day (BID) | ORAL | 0 refills | Status: DC

## 2024-04-10 ENCOUNTER — Encounter: Payer: Self-pay | Admitting: Physician Assistant

## 2024-04-10 ENCOUNTER — Telehealth: Payer: Self-pay

## 2024-04-10 DIAGNOSIS — Z1231 Encounter for screening mammogram for malignant neoplasm of breast: Secondary | ICD-10-CM

## 2024-04-10 NOTE — Telephone Encounter (Unsigned)
 Copied from CRM [id number]. Topic: Clinical - Request for Lab/Test Order >> Apr 10, 2024  3:43 PM Sasha H wrote: Reason for CRM: Pt is requesting to have mammo scheduled.  ----------------------------------------------------------------------- From previous Reason for Contact - Lab/Test Results: Reason for CRM:

## 2024-05-22 ENCOUNTER — Ambulatory Visit
Admission: RE | Admit: 2024-05-22 | Discharge: 2024-05-22 | Disposition: A | Discharge status: Home / Self Care | Source: Ambulatory Visit | Attending: Family Medicine | Admitting: Family Medicine

## 2024-05-22 DIAGNOSIS — Z1231 Encounter for screening mammogram for malignant neoplasm of breast: Secondary | ICD-10-CM | POA: Insufficient documentation

## 2024-05-24 ENCOUNTER — Ambulatory Visit: Payer: Self-pay | Admitting: Physician Assistant

## 2024-06-01 ENCOUNTER — Ambulatory Visit: Admitting: Family Medicine

## 2024-07-11 ENCOUNTER — Ambulatory Visit: Admitting: Family Medicine

## 2024-07-11 ENCOUNTER — Encounter: Payer: Self-pay | Admitting: Family Medicine

## 2024-07-11 VITALS — BP 144/80 | HR 62 | Temp 98.2°F | Ht 66.0 in | Wt 173.1 lb

## 2024-07-11 DIAGNOSIS — R1013 Epigastric pain: Secondary | ICD-10-CM

## 2024-07-11 DIAGNOSIS — E785 Hyperlipidemia, unspecified: Secondary | ICD-10-CM

## 2024-07-11 DIAGNOSIS — E1159 Type 2 diabetes mellitus with other circulatory complications: Secondary | ICD-10-CM

## 2024-07-11 DIAGNOSIS — E611 Iron deficiency: Secondary | ICD-10-CM

## 2024-07-11 DIAGNOSIS — E1169 Type 2 diabetes mellitus with other specified complication: Secondary | ICD-10-CM | POA: Diagnosis not present

## 2024-07-11 DIAGNOSIS — E559 Vitamin D deficiency, unspecified: Secondary | ICD-10-CM | POA: Diagnosis not present

## 2024-07-11 DIAGNOSIS — Z23 Encounter for immunization: Secondary | ICD-10-CM

## 2024-07-11 DIAGNOSIS — D508 Other iron deficiency anemias: Secondary | ICD-10-CM

## 2024-07-11 DIAGNOSIS — M5416 Radiculopathy, lumbar region: Secondary | ICD-10-CM

## 2024-07-11 DIAGNOSIS — K219 Gastro-esophageal reflux disease without esophagitis: Secondary | ICD-10-CM | POA: Diagnosis not present

## 2024-07-11 DIAGNOSIS — G8929 Other chronic pain: Secondary | ICD-10-CM

## 2024-07-11 DIAGNOSIS — Z800 Family history of malignant neoplasm of digestive organs: Secondary | ICD-10-CM

## 2024-07-11 DIAGNOSIS — I152 Hypertension secondary to endocrine disorders: Secondary | ICD-10-CM

## 2024-07-11 DIAGNOSIS — E119 Type 2 diabetes mellitus without complications: Secondary | ICD-10-CM

## 2024-07-11 DIAGNOSIS — E6609 Other obesity due to excess calories: Secondary | ICD-10-CM

## 2024-07-11 MED ORDER — BACLOFEN 10 MG PO TABS: 10.0000 mg | ORAL_TABLET | Freq: Three times a day (TID) | ORAL | 0 refills | Status: DC

## 2024-07-11 MED ORDER — METFORMIN HCL 1000 MG PO TABS: 1000.0000 mg | ORAL_TABLET | Freq: Every day | ORAL | Status: DC

## 2024-07-11 NOTE — Patient Instructions (Signed)
 To keep you healthy, please keep in mind the following health maintenance items that you are due for:   Health Maintenance Due  Topic Date Due   DTaP/Tdap/Td (3 - Tdap) 10/31/2022   HEMOGLOBIN A1C  09/24/2023   OPHTHALMOLOGY EXAM  09/30/2023   Diabetic kidney evaluation - Urine ACR  03/24/2024     Best Wishes,   Dr. Lang

## 2024-07-11 NOTE — Progress Notes (Signed)
 "     Established patient visit   Patient: Jacqueline Romero   DOB: [birth year]   58 y.o. Female  MRN: [id number] Visit Date: 07/11/2024  Today's healthcare provider: Rockie Agent, MD   Chief Complaint  Patient presents with   Medical Management of Chronic Issues    Patient has concerns with family history of pancreatic cancer. Reports mom passed form this at age 66. Sister was recently diagnosed and she is 51. Wants to know if she can have testing done, no new symptoms.    Establish Care    Patient is establishing with new pcp, LOV in June 2024.    Back Pain    Onset Saturday, bilateral lower back. When patient stands she states pain radiates down the back of her right leg. Has HX of kidney stones    Subjective     HPI     Medical Management of Chronic Issues    Additional comments: Patient has concerns with family history of pancreatic cancer. Reports mom passed form this at age 26. Sister was recently diagnosed and she is 35. Wants to know if she can have testing done, no new symptoms.         Establish Care    Additional comments: Patient is establishing with new pcp, LOV in June 2024.         Back Pain    Additional comments: Onset Saturday, bilateral lower back. When patient stands she states pain radiates down the back of her right leg. Has HX of kidney stones       Last edited by Cherry Chiquita HERO, CMA on 07/11/2024  3:14 PM.       Discussed the use of AI scribe software for clinical note transcription with the patient, who gave verbal consent to proceed.  History of Present Illness Jacqueline Romero is a 58 year old female with type 2 diabetes, hypertension, and hyperlipidemia who presents for a chronic follow-up visit and to establish care with new PCP.  She is establishing care with a new primary care provider and is concerned about her risk of pancreatic cancer due to her family history. Her mother passed away at age 11 and her sister was  recently diagnosed at age 41. She inquires about potential testing options.  Her type 2 diabetes was diagnosed at age 72 and is managed with Januvia  100 mg daily and metformin  1000 mg once daily. Morning blood sugar levels range from 120 to 138 mg/dL. She occasionally misses doses and has experienced recent weight loss, which she attributes to working two jobs and changes in her eating habits.  She has hypertension associated with her diabetes and takes losartan  25 mg daily.  Her hyperlipidemia is managed with simvastatin  20 mg daily.  She experiences bilateral low back pain that began on Saturday, severe enough to require her to roll out of bed on Sunday. The pain is located at the bottom of her back and sometimes radiates down her leg. She uses a heating pad for relief.  She has a history of kidney stones and pancreatitis, with chronic epigastric pain described as 'my stomach always hurts.' She has had gallstones in the past and notes that certain foods exacerbate her stomach pain.  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     Past Medical History:  Diagnosis Date   Anemia    BRCA negative 12/2019  MyRisk neg except CDKN2A and 3-MSH3 VUS; IBIS=16.1%   Chronic kidney disease    Diabetes mellitus without complication (HCC)    Diverticulosis    Duodenitis    Family history of pancreatic cancer    Heart murmur    Hypertension    Kidney stone    Migraine     Medications: Outpatient Medications Prior to Visit  Medication Sig   Blood Glucose Monitoring Suppl (ACCU-CHEK AVIVA PLUS) w/Device KIT Use to check blood sugar fasting daily and as needed   Cholecalciferol (VITAMIN D ) 2000 UNITS CAPS Take 2,000 Units by mouth daily.   glucose blood (ACCU-CHEK AVIVA PLUS) test strip Use to check blood sugar fasting daily and as needed   JANUVIA  100 MG tablet Take 1 tablet by mouth once daily    Lancets (ACCU-CHEK MULTICLIX) lancets Use to check blood sugar fasting daily and as needed   losartan  (COZAAR ) 25 MG tablet Take 1 tablet (25 mg total) by mouth daily.   Multiple Vitamins-Minerals (WOMENS MULTIVITAMIN PLUS PO) Take 1 tablet by mouth daily.   simvastatin  (ZOCOR ) 20 MG tablet Take 1 tablet (20 mg total) by mouth at bedtime.   [DISCONTINUED] glipiZIDE  (GLUCOTROL ) 5 MG tablet Take 1 tablet (5 mg total) by mouth daily before breakfast.   [DISCONTINUED] HYDROcodone -acetaminophen  (NORCO/VICODIN) 5-325 MG tablet Take 1 tablet by mouth every 6 (six) hours as needed for severe pain (pain score 7-10).   [DISCONTINUED] meloxicam  (MOBIC ) 15 MG tablet Take 1 tablet (15 mg total) by mouth daily.   [DISCONTINUED] metFORMIN  (GLUCOPHAGE ) 1000 MG tablet Take 1 tablet (1,000 mg total) by mouth 2 (two) times daily.   [DISCONTINUED] naproxen  (NAPROSYN ) 500 MG tablet Take 1 tablet (500 mg total) by mouth 2 (two) times daily with a meal.   [DISCONTINUED] ondansetron  (ZOFRAN -ODT) 4 MG disintegrating tablet Take 1 tablet (4 mg total) by mouth every 8 (eight) hours as needed for nausea or vomiting.   [DISCONTINUED] tamsulosin  (FLOMAX ) 0.4 MG CAPS capsule Take 1 capsule (0.4 mg total) by mouth daily.   No facility-administered medications prior to visit.    Review of Systems  Last metabolic panel Lab Results  Component Value Date   GLUCOSE 123 (H) 07/11/2024   NA 145 (H) 07/11/2024   K 4.3 07/11/2024   CL 107 (H) 07/11/2024   CO2 24 07/11/2024   BUN 14 07/11/2024   CREATININE 0.87 07/11/2024   GFRNONAA >60 09/20/2023   CALCIUM  9.3 07/11/2024   PROT 6.7 07/11/2024   ALBUMIN 4.5 07/11/2024   LABGLOB 2.2 07/11/2024   AGRATIO 1.7 03/31/2022   BILITOT 0.3 07/11/2024   ALKPHOS 67 07/11/2024   AST 13 07/11/2024   ALT 8 07/11/2024   ANIONGAP 9 09/20/2023   Last lipids Lab Results  Component Value Date   CHOL 190 04/05/2023   HDL 64 04/05/2023   LDLCALC 113 (H) 04/05/2023   TRIG 69  04/05/2023   CHOLHDL 3.0 04/05/2023   Last hemoglobin A1c Lab Results  Component Value Date   HGBA1C 7.7 (H) 07/11/2024   Last thyroid functions Lab Results  Component Value Date   TSH 1.190 03/31/2022        Objective    BP (!) 144/80 (BP Location: Left Arm, Patient Position: Sitting, Cuff Size: Normal)   Pulse 62   Temp 98.2 F (36.8 C) (Oral)   Ht 5' 6 (1.676 m)   Wt 173 lb 1.6 oz (78.5 kg)   SpO2 100%   BMI 27.94 kg/m  BP Readings from  Last 3 Encounters:  07/11/24 (!) 144/80  11/10/23 128/75  10/08/23 125/76   Wt Readings from Last 3 Encounters:  07/11/24 173 lb 1.6 oz (78.5 kg)  11/10/23 178 lb (80.7 kg)  10/08/23 171 lb (77.6 kg)        Physical Exam  Physical Exam VITALS: BP- 144/80 CARD: RRR  CHEST: Clear to auscultation bilaterally. ABDOMEN: Tender on palpation in epigastric region. EXTREMITIES: Good pulses and circulation. NEUROLOGICAL: Sensation intact in extremities. FEET: no ulcerations/skin breakdown, normal sensation with monofilament testing  MSK: muscle spasms in bilateral lumbar regions    Results for orders placed or performed in visit on 07/11/24  Urine Albumin/Creatinine with ratio (send out) [LAB689]  Result Value Ref Range   Creatinine, Urine WILL FOLLOW    Microalbumin, Urine WILL FOLLOW    Microalb/Creat Ratio WILL FOLLOW   Hemoglobin A1c  Result Value Ref Range   Hgb A1c MFr Bld 7.7 (H) 4.8 - 5.6 %   Est. average glucose Bld gHb Est-mCnc 174 mg/dL  RFE85+ZHQM  Result Value Ref Range   Glucose 123 (H) 70 - 99 mg/dL   BUN 14 6 - 24 mg/dL   Creatinine, Ser 9.12 0.57 - 1.00 mg/dL   eGFR 77 >40 fO/fpw/8.26   BUN/Creatinine Ratio 16 9 - 23   Sodium 145 (H) 134 - 144 mmol/L   Potassium 4.3 3.5 - 5.2 mmol/L   Chloride 107 (H) 96 - 106 mmol/L   CO2 24 20 - 29 mmol/L   Calcium  9.3 8.7 - 10.2 mg/dL   Total Protein 6.7 6.0 - 8.5 g/dL   Albumin 4.5 3.8 - 4.9 g/dL   Globulin, Total 2.2 1.5 - 4.5 g/dL   Bilirubin Total 0.3 0.0 -  1.2 mg/dL   Alkaline Phosphatase 67 49 - 135 IU/L   AST 13 0 - 40 IU/L   ALT 8 0 - 32 IU/L  CBC  Result Value Ref Range   WBC 5.4 3.4 - 10.8 x10E3/uL   RBC 4.67 3.77 - 5.28 x10E6/uL   Hemoglobin 13.8 11.1 - 15.9 g/dL   Hematocrit 57.3 65.9 - 46.6 %   MCV 91 79 - 97 fL   MCH 29.6 26.6 - 33.0 pg   MCHC 32.4 31.5 - 35.7 g/dL   RDW 87.6 88.2 - 84.5 %   Platelets 227 150 - 450 x10E3/uL  VITAMIN D  25 Hydroxy (Vit-D Deficiency, Fractures)  Result Value Ref Range   Vit D, 25-Hydroxy 15.6 (L) 30.0 - 100.0 ng/mL  Lipase  Result Value Ref Range   Lipase 44 14 - 72 U/L    Assessment & Plan     Problem List Items Addressed This Visit     Anemia   Chronic  No reported bleeding symptoms  CBC ordered       Diabetes mellitus, type 2 (HCC) - Primary   Type 2 diabetes mellitus with hypertension and hyperlipidemia Chronic Long-standing type 2 diabetes mellitus diagnosed at age 63, managed with Januvia  and metformin . Blood sugar levels are generally well-controlled with occasional elevations. Hypertension and hyperlipidemia are associated with diabetes. Blood pressure was elevated at 144/80. Previous A1c was 7.7%, with a target of under 8%. - Order A1c test - Order comprehensive metabolic panel (CMP) - Order urine albumin test - Update metformin  dosage to once daily - Discontinue glipizide  due to dizziness and nausea - Continue Januvia  100 mg daily - Continue losartan  25 mg daily - Continue simvastatin  20 mg daily - Order CBC and vitamin D  level -  Recheck blood pressure - Schedule diabetes eye exa      Relevant Medications   metFORMIN  (GLUCOPHAGE ) 1000 MG tablet   Other Relevant Orders   HM Diabetes Foot Exam (Completed)   Hemoglobin A1c (Completed)   Epigastric abdominal pain   Chronic  Some tenderness to deep palpation of epigastric region  Lipase level ordered       Family history of pancreatic cancer   Chronic epigastric pain with gallstones. Pain is tender and possibly  related to dietary intake.  No acute symptoms of pancreatitis, but family history of pancreatic cancer is noted. - Order lipase level - Consider genetic testing consultation with sister's oncologist      GERD (gastroesophageal reflux disease)   Chronic  Symptoms well managed with dietary adjustments  No current meds  Continue lifestyle management       Hyperlipidemia associated with type 2 diabetes mellitus (HCC)   Chronic  Continue simvastatin  20mg  daily  Wait until CPE for fasting lipid panel       Relevant Medications   metFORMIN  (GLUCOPHAGE ) 1000 MG tablet   Hypertension associated with diabetes (HCC)   Chronic  BP elevated in clinic  Continue losartan  25mg  daily  CMP ordered        Relevant Medications   metFORMIN  (GLUCOPHAGE ) 1000 MG tablet   Other Relevant Orders   Urine Albumin/Creatinine with ratio (send out) [LAB689] (Completed)   CMP14+EGFR (Completed)   Obesity   Resolved  BMI in overweight category, 27.94      Relevant Medications   metFORMIN  (GLUCOPHAGE ) 1000 MG tablet   Vitamin D  deficiency   Chronic condition  Check vitamin D  levels today  Continue OTC vitamin D  supplement       Relevant Orders   VITAMIN D  25 Hydroxy (Vit-D Deficiency, Fractures) (Completed)   Other Visit Diagnoses       Immunization due       Relevant Orders   Tdap vaccine greater than or equal to 7yo IM (Completed)     Need for Tdap vaccination       Relevant Orders   Tdap vaccine greater than or equal to 7yo IM (Completed)     Iron deficiency       Relevant Orders   CBC (Completed)     Chronic epigastric pain       Relevant Medications   baclofen  (LIORESAL ) 10 MG tablet   Other Relevant Orders   Lipase     Lumbar back pain with radiculopathy affecting right lower extremity       Relevant Medications   baclofen  (LIORESAL ) 10 MG tablet       Assessment and Plan Assessment & Plan    Chronic low back pain Intermittent symptoms  Bilateral low back pain,  possibly related to kidney stones. Pain is severe enough to require rolling out of bed, radiates down the leg, and is exacerbated by standing. No previous use of muscle relaxants. - Prescribe baclofen  10 mg, start with bedtime dosing - Advise use of heating pad - Monitor and report if pain persists    General Health Maintenance Request for updated tetanus vaccine. Declined flu vaccine. - Administer tetanus vaccine     Return in about 3 months (around 10/10/2024) for HTN, DM.         Rockie Agent, MD  [facility] [phone] (phone) [phone] (fax)  [facility] "

## 2024-07-12 LAB — CMP14+EGFR
ALT: 8 IU/L (ref 0–32)
AST: 13 IU/L (ref 0–40)
Albumin: 4.5 g/dL (ref 3.8–4.9)
Alkaline Phosphatase: 67 IU/L (ref 49–135)
BUN/Creatinine Ratio: 16 (ref 9–23)
BUN: 14 mg/dL (ref 6–24)
Bilirubin Total: 0.3 mg/dL (ref 0.0–1.2)
CO2: 24 mmol/L (ref 20–29)
Calcium: 9.3 mg/dL (ref 8.7–10.2)
Chloride: 107 mmol/L — ABNORMAL HIGH (ref 96–106)
Creatinine, Ser: 0.87 mg/dL (ref 0.57–1.00)
Globulin, Total: 2.2 g/dL (ref 1.5–4.5)
Glucose: 123 mg/dL — ABNORMAL HIGH (ref 70–99)
Potassium: 4.3 mmol/L (ref 3.5–5.2)
Sodium: 145 mmol/L — ABNORMAL HIGH (ref 134–144)
Total Protein: 6.7 g/dL (ref 6.0–8.5)
eGFR: 77 mL/min/1.73 (ref >59)

## 2024-07-12 LAB — HEMOGLOBIN A1C
Est. average glucose Bld gHb Est-mCnc: 174 mg/dL
Hgb A1c MFr Bld: 7.7 % — ABNORMAL HIGH (ref 4.8–5.6)

## 2024-07-12 LAB — MICROALBUMIN / CREATININE URINE RATIO
Creatinine, Urine: 152.2 mg/dL
Microalb/Creat Ratio: 5 mg/g{creat} (ref 0–29)
Microalbumin, Urine: 7.6 ug/mL

## 2024-07-12 LAB — CBC
Hematocrit: 42.6 % (ref 34.0–46.6)
Hemoglobin: 13.8 g/dL (ref 11.1–15.9)
MCH: 29.6 pg (ref 26.6–33.0)
MCHC: 32.4 g/dL (ref 31.5–35.7)
MCV: 91 fL (ref 79–97)
Platelets: 227 x10E3/uL (ref 150–450)
RBC: 4.67 x10E6/uL (ref 3.77–5.28)
RDW: 12.3 % (ref 11.7–15.4)
WBC: 5.4 x10E3/uL (ref 3.4–10.8)

## 2024-07-12 LAB — LIPASE: Lipase: 44 U/L (ref 14–72)

## 2024-07-12 LAB — VITAMIN D 25 HYDROXY (VIT D DEFICIENCY, FRACTURES): Vit D, 25-Hydroxy: 15.6 ng/mL — ABNORMAL LOW (ref 30.0–100.0)

## 2024-07-12 NOTE — Assessment & Plan Note (Signed)
 Type 2 diabetes mellitus with hypertension and hyperlipidemia Chronic Long-standing type 2 diabetes mellitus diagnosed at age 58, managed with Januvia  and metformin . Blood sugar levels are generally well-controlled with occasional elevations. Hypertension and hyperlipidemia are associated with diabetes. Blood pressure was elevated at 144/80. Previous A1c was 7.7%, with a target of under 8%. - Order A1c test - Order comprehensive metabolic panel (CMP) - Order urine albumin test - Update metformin  dosage to once daily - Discontinue glipizide  due to dizziness and nausea - Continue Januvia  100 mg daily - Continue losartan  25 mg daily - Continue simvastatin  20 mg daily - Order CBC and vitamin D  level - Recheck blood pressure - Schedule diabetes eye exa

## 2024-07-12 NOTE — Assessment & Plan Note (Signed)
 Chronic condition  Check vitamin D  levels today  Continue OTC vitamin D  supplement

## 2024-07-12 NOTE — Assessment & Plan Note (Signed)
 Resolved  BMI in overweight category, 27.94

## 2024-07-12 NOTE — Assessment & Plan Note (Signed)
 Chronic  No reported bleeding symptoms  CBC ordered

## 2024-07-12 NOTE — Assessment & Plan Note (Signed)
 Chronic epigastric pain with gallstones. Pain is tender and possibly related to dietary intake.  No acute symptoms of pancreatitis, but family history of pancreatic cancer is noted. - Order lipase level - Consider genetic testing consultation with sister's oncologist

## 2024-07-12 NOTE — Assessment & Plan Note (Signed)
 Chronic  Symptoms well managed with dietary adjustments  No current meds  Continue lifestyle management

## 2024-07-12 NOTE — Assessment & Plan Note (Signed)
 Chronic  BP elevated in clinic  Continue losartan  25mg  daily  CMP ordered

## 2024-07-12 NOTE — Assessment & Plan Note (Signed)
 Chronic  Continue simvastatin  20mg  daily  Wait until CPE for fasting lipid panel

## 2024-07-12 NOTE — Assessment & Plan Note (Signed)
 Chronic  Some tenderness to deep palpation of epigastric region  Lipase level ordered

## 2024-07-14 ENCOUNTER — Other Ambulatory Visit: Payer: Self-pay | Admitting: Family Medicine

## 2024-07-14 ENCOUNTER — Ambulatory Visit: Payer: Self-pay | Admitting: Family Medicine

## 2024-07-14 MED ORDER — VITAMIN D (ERGOCALCIFEROL) 1.25 MG (50000 UNIT) PO CAPS: 50000.0000 [IU] | ORAL_CAPSULE | ORAL | 1 refills | Status: AC

## 2024-09-05 ENCOUNTER — Ambulatory Visit
Admission: RE | Admit: 2024-09-05 | Discharge: 2024-09-05 | Disposition: A | Discharge status: Home / Self Care | Attending: Family Medicine | Admitting: Family Medicine

## 2024-09-05 ENCOUNTER — Encounter: Payer: Self-pay | Admitting: Family Medicine

## 2024-09-05 ENCOUNTER — Telehealth (INDEPENDENT_AMBULATORY_CARE_PROVIDER_SITE_OTHER): Admitting: Family Medicine

## 2024-09-05 ENCOUNTER — Ambulatory Visit
Admission: RE | Admit: 2024-09-05 | Discharge: 2024-09-05 | Disposition: A | Source: Ambulatory Visit | Attending: Family Medicine

## 2024-09-05 DIAGNOSIS — M792 Neuralgia and neuritis, unspecified: Secondary | ICD-10-CM | POA: Insufficient documentation

## 2024-09-05 MED ORDER — GABAPENTIN 100 MG PO CAPS: 100.0000 mg | ORAL_CAPSULE | Freq: Every day | ORAL | 3 refills | Status: DC

## 2024-09-05 MED ORDER — MELOXICAM 7.5 MG PO TABS: 7.5000 mg | ORAL_TABLET | Freq: Every day | ORAL | 0 refills | Status: DC
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
## 2024-09-06 ENCOUNTER — Telehealth: Admitting: Physician Assistant

## 2024-09-06 DIAGNOSIS — M792 Neuralgia and neuritis, unspecified: Secondary | ICD-10-CM | POA: Diagnosis not present

## 2024-09-06 DIAGNOSIS — M79602 Pain in left arm: Secondary | ICD-10-CM | POA: Diagnosis not present

## 2024-09-06 MED ORDER — GABAPENTIN 100 MG PO CAPS: 100.0000 mg | ORAL_CAPSULE | Freq: Three times a day (TID) | ORAL | Status: DC | PRN

## 2024-09-06 MED ORDER — METHYLPREDNISOLONE 4 MG PO TBPK: ORAL_TABLET | ORAL | 0 refills | Status: DC

## 2024-09-06 NOTE — Patient Instructions (Signed)
 Jacqueline Romero, thank you for joining Jacqueline CHRISTELLA Dickinson, PA-C for today's virtual visit.  While this provider is not your primary care provider (PCP), if your PCP is located in our provider database this encounter information will be shared with them immediately following your visit.   A [facility] MyChart account gives you access to today's visit and all your visits, tests, and labs performed at [facility]  click here if you don't have a [facility] MyChart account or go to [email or web address]  Consent: (Patient) Jacqueline Romero verbal consent for this virtual visit at the beginning of the encounter.  Current Medications:  Current Outpatient Medications:    methylPREDNISolone  (MEDROL  DOSEPAK) 4 MG TBPK tablet, 6 day taper; take as directed on package instructions, Disp: 21 tablet, Rfl: 0   baclofen  (LIORESAL ) 10 MG tablet, Take 1 tablet (10 mg total) by mouth 3 (three) times daily., Disp: 30 each, Rfl: 0   Blood Glucose Monitoring Suppl (ACCU-CHEK AVIVA PLUS) w/Device KIT, Use to check blood sugar fasting daily and as needed, Disp: 1 kit, Rfl: 0   Cholecalciferol (VITAMIN D ) 2000 UNITS CAPS, Take 2,000 Units by mouth daily., Disp: , Rfl:    gabapentin  (NEURONTIN ) 100 MG capsule, Take 1 capsule (100 mg total) by mouth 3 (three) times daily as needed., Disp: , Rfl:    glucose blood (ACCU-CHEK AVIVA PLUS) test strip, Use to check blood sugar fasting daily and as needed, Disp: 100 each, Rfl: 3   JANUVIA  100 MG tablet, Take 1 tablet by mouth once daily, Disp: 30 tablet, Rfl: 0   Lancets (ACCU-CHEK MULTICLIX) lancets, Use to check blood sugar fasting daily and as needed, Disp: 100 each, Rfl: 3   losartan  (COZAAR ) 25 MG tablet, Take 1 tablet (25 mg total) by mouth daily., Disp: 90 tablet, Rfl: 3   meloxicam  (MOBIC ) 7.5 MG tablet, Take 1 tablet (7.5 mg total) by mouth daily., Disp: 30 tablet, Rfl: 0   metFORMIN  (GLUCOPHAGE ) 1000 MG tablet, Take 1 tablet (1,000 mg  total) by mouth daily with breakfast., Disp: , Rfl:    Multiple Vitamins-Minerals (WOMENS MULTIVITAMIN PLUS PO), Take 1 tablet by mouth daily., Disp: , Rfl:    simvastatin  (ZOCOR ) 20 MG tablet, Take 1 tablet (20 mg total) by mouth at bedtime., Disp: 90 tablet, Rfl: 3   Vitamin D , Ergocalciferol , (DRISDOL ) 1.25 MG (50000 UNIT) CAPS capsule, Take 1 capsule (50,000 Units total) by mouth every 7 (seven) days., Disp: 12 capsule, Rfl: 1   Medications ordered in this encounter:  Meds ordered this encounter  Medications   methylPREDNISolone  (MEDROL  DOSEPAK) 4 MG TBPK tablet    Sig: 6 day taper; take as directed on package instructions    Dispense:  21 tablet    Refill:  0    Supervising Provider:   LAMPTEY, PHILIP Romero [[id number]]   gabapentin  (NEURONTIN ) 100 MG capsule    Sig: Take 1 capsule (100 mg total) by mouth 3 (three) times daily as needed.    Supervising Provider:   BLAISE ALEENE Romero [[id number]]     *If you need refills on other medications prior to your next appointment, please contact your pharmacy*  Follow-Up: Call back or seek an in-person evaluation if the symptoms worsen or if the condition fails to improve as anticipated.  [facility] [phone]  Other Instructions  Shoulder Exercises Ask your health care provider which exercises are safe for you. Do exercises exactly as told by your health care provider and  adjust them as directed. It is normal to feel mild stretching, pulling, tightness, or discomfort as you do these exercises. Stop right away if you feel sudden pain or your pain gets worse. Do not begin these exercises until told by your health care provider. Stretching exercises External rotation and abduction This exercise is sometimes called corner stretch. The exercise rotates your arm outward (external rotation) and moves your arm out from your body (abduction). Stand in a doorway with one of your feet slightly in front of the other. This is called a  staggered stance. If you cannot reach your forearms to the door frame, stand facing a corner of a room. Choose one of the following positions as told by your health care provider: Place your hands and forearms on the door frame above your head. Place your hands and forearms on the door frame at the height of your head. Place your hands on the door frame at the height of your elbows. Slowly move your weight onto your front foot until you feel a stretch across your chest and in the front of your shoulders. Keep your head and chest upright and keep your abdominal muscles tight. Hold for __________ seconds. To release the stretch, shift your weight to your back foot. Repeat __________ times. Complete this exercise __________ times a day. Extension, standing  Stand and hold a broomstick, a cane, or a similar object behind your back. Your hands should be a little wider than shoulder-width apart. Your palms should face away from your back. Keeping your elbows straight and your shoulder muscles relaxed, move the stick away from your body until you feel a stretch in your shoulders (extension). Avoid shrugging your shoulders while you move the stick. Keep your shoulder blades tucked down toward the middle of your back. Hold for __________ seconds. Slowly return to the starting position. Repeat __________ times. Complete this exercise __________ times a day. Range-of-motion exercises Pendulum  Stand near a wall or a surface that you can hold onto for balance. Bend at the waist and let your left / right arm hang straight down. Use your other arm to support you. Keep your back straight and do not lock your knees. Relax your left / right arm and shoulder muscles, and move your hips and your trunk so your left / right arm swings freely. Your arm should swing because of the motion of your body, not because you are using your arm or shoulder muscles. Keep moving your hips and trunk so your arm swings in the  following directions, as told by your health care provider: Side to side. Forward and backward. In clockwise and counterclockwise circles. Continue each motion for __________ seconds, or for as long as told by your health care provider. Slowly return to the starting position. Repeat __________ times. Complete this exercise __________ times a day. Shoulder flexion, standing  Stand and hold a broomstick, a cane, or a similar object. Place your hands a little more than shoulder-width apart on the object. Your left / right hand should be palm-up, and your other hand should be palm-down. Keep your elbow straight and your shoulder muscles relaxed. Push the stick up with your healthy arm to raise your left / right arm in front of your body, and then over your head until you feel a stretch in your shoulder (flexion). Avoid shrugging your shoulder while you raise your arm. Keep your shoulder blade tucked down toward the middle of your back. Hold for __________ seconds. Slowly return  to the starting position. Repeat __________ times. Complete this exercise __________ times a day. Shoulder abduction, standing  Stand and hold a broomstick, a cane, or a similar object. Place your hands a little more than shoulder-width apart on the object. Your left / right hand should be palm-up, and your other hand should be palm-down. Keep your elbow straight and your shoulder muscles relaxed. Push the object across your body toward your left / right side. Raise your left / right arm to the side of your body (abduction) until you feel a stretch in your shoulder. Do not raise your arm above shoulder height unless your health care provider tells you to do that. If directed, raise your arm over your head. Avoid shrugging your shoulder while you raise your arm. Keep your shoulder blade tucked down toward the middle of your back. Hold for __________ seconds. Slowly return to the starting position. Repeat __________ times.  Complete this exercise __________ times a day. Internal rotation  Place your left / right hand behind your back, palm-up. Use your other hand to dangle an exercise band, a broomstick, or a similar object over your shoulder. Grasp the band with your left / right hand so you are holding on to both ends. Gently pull up on the band until you feel a stretch in the front of your left / right shoulder. The movement of your arm toward the center of your body is called internal rotation. Avoid shrugging your shoulder while you raise your arm. Keep your shoulder blade tucked down toward the middle of your back. Hold for __________ seconds. Release the stretch by letting go of the band and lowering your hands. Repeat __________ times. Complete this exercise __________ times a day. Strengthening exercises External rotation  Sit in a stable chair without armrests. Secure an exercise band to a stable object at elbow height on your left / right side. Place a soft object, such as a folded towel or a small pillow, between your left / right upper arm and your body to move your elbow about 4 inches (10 cm) away from your side. Hold the end of the exercise band so it is tight and there is no slack. Keeping your elbow pressed against the soft object, slowly move your forearm out, away from your abdomen (external rotation). Keep your body steady so only your forearm moves. Hold for __________ seconds. Slowly return to the starting position. Repeat __________ times. Complete this exercise __________ times a day. Shoulder abduction  Sit in a stable chair without armrests, or stand up. Hold a __________ lb / kg weight in your left / right hand, or hold an exercise band with both hands. Start with your arms straight down and your left / right palm facing in, toward your body. Slowly lift your left / right hand out to your side (abduction). Do not lift your hand above shoulder height unless your health care provider  tells you that this is safe. Keep your arms straight. Avoid shrugging your shoulder while you do this movement. Keep your shoulder blade tucked down toward the middle of your back. Hold for __________ seconds. Slowly lower your arm, and return to the starting position. Repeat __________ times. Complete this exercise __________ times a day. Shoulder extension  Sit in a stable chair without armrests, or stand up. Secure an exercise band to a stable object in front of you so it is at shoulder height. Hold one end of the exercise band in each hand. Straighten  your elbows and lift your hands up to shoulder height. Squeeze your shoulder blades together as you pull your hands down to the sides of your thighs (extension). Stop when your hands are straight down by your sides. Do not let your hands go behind your body. Hold for __________ seconds. Slowly return to the starting position. Repeat __________ times. Complete this exercise __________ times a day. Shoulder row  Sit in a stable chair without armrests, or stand up. Secure an exercise band to a stable object in front of you so it is at chest height. Hold one end of the exercise band in each hand. Position your palms so that your thumbs are facing the ceiling (neutral position). Bend each of your elbows to a 90-degree angle (right angle) and keep your upper arms at your sides. Step back or move the chair back until the band is tight and there is no slack. Slowly pull your elbows back behind you. Hold for __________ seconds. Slowly return to the starting position. Repeat __________ times. Complete this exercise __________ times a day. Shoulder press-ups  Sit in a stable chair that has armrests. Sit upright, with your feet flat on the floor. Put your hands on the armrests so your elbows are bent and your fingers are pointing forward. Your hands should be about even with the sides of your body. Push down on the armrests and use your arms to  lift yourself off the chair. Straighten your elbows and lift yourself up as much as you comfortably can. Move your shoulder blades down, and avoid letting your shoulders move up toward your ears. Keep your feet on the ground. As you get stronger, your feet should support less of your body weight as you lift yourself up. Hold for __________ seconds. Slowly lower yourself back into the chair. Repeat __________ times. Complete this exercise __________ times a day. Wall push-ups  Stand so you are facing a stable wall. Your feet should be about one arm-length away from the wall. Lean forward and place your palms on the wall at shoulder height. Keep your feet flat on the floor as you bend your elbows and lean forward toward the wall. Hold for __________ seconds. Straighten your elbows to push yourself back to the starting position. Repeat __________ times. Complete this exercise __________ times a day. This information is not intended to replace advice given to you by your health care provider. Make sure you discuss any questions you have with your health care provider. Document Revised: 11/25/2021 Document Reviewed: 11/25/2021 Elsevier Patient Education  2024 Elsevier Inc.   If you have been instructed to have an in-person evaluation today at a local Urgent Care facility, please use the link below. It will take you to a list of all of our available [facility], including address, phone number and hours of operation. Please do not delay care.  [facility]  If you or a family member do not have a primary care provider, use the link below to schedule a visit and establish care. When you choose a [facility] primary care physician or advanced practice provider, you gain a long-term partner in health. Find a Primary Care Provider  Learn more about [facility]'s in-office and virtual care options:  - Get Care Now

## 2024-09-06 NOTE — Progress Notes (Signed)
 Virtual Visit Consent   Jacqueline Romero, you are scheduled for a virtual visit with a [facility] provider today. Just as with appointments in the office, your consent must be obtained to participate. Your consent will be active for this visit and any virtual visit you may have with one of our providers in the next 365 days. If you have a MyChart account, a copy of this consent can be sent to you electronically.  As this is a virtual visit, video technology does not allow for your provider to perform a traditional examination. This may limit your provider's ability to fully assess your condition. If your provider identifies any concerns that need to be evaluated in person or the need to arrange testing (such as labs, EKG, etc.), we will make arrangements to do so. Although advances in technology are sophisticated, we cannot ensure that it will always work on either your end or our end. If the connection with a video visit is poor, the visit may have to be switched to a telephone visit. With either a video or telephone visit, we are not always able to ensure that we have a secure connection.  By engaging in this virtual visit, you consent to the provision of healthcare and authorize for your insurance to be billed (if applicable) for the services provided during this visit. Depending on your insurance coverage, you may receive a charge related to this service.  I need to obtain your verbal consent now. Are you willing to proceed with your visit today? Jacqueline Romero has provided verbal consent on 09/06/2024 for a virtual visit (video or telephone). Jacqueline CHRISTELLA Dickinson, PA-C  Date: 09/06/2024 11:04 AM   Virtual Visit via Video Note   I, Jacqueline Romero, connected with  Jacqueline Romero  ([id number], Jul 31, 1966) on 09/06/24 at 10:45 AM EST by a video-enabled telemedicine application and verified that I am speaking with the correct person using two identifiers.  Location: Patient: Virtual  Visit Location Patient: Mobile Provider: Virtual Visit Location Provider: Home Office   I discussed the limitations of evaluation and management by telemedicine and the availability of in person appointments. The patient expressed understanding and agreed to proceed.    History of Present Illness: Jacqueline Romero is a 58 y.o. who identifies as a female who was assigned female at birth, and is being seen today for continued arm pain.  HPI: Arm Pain  The incident occurred 3 to 5 days ago. The incident occurred at home. There was no injury mechanism. The pain is present in the left shoulder (pain is in left shoulder blade area of the back). The quality of the pain is described as aching and shooting. The pain radiates to the left arm (radiates into posterior left shoulder and left arm towards elbow). The pain is severe. The pain has been Constant since the incident. Associated symptoms include tingling. Pertinent negatives include no chest pain, muscle weakness or numbness. The symptoms are aggravated by movement and lifting. She has tried NSAIDs and heat (Meloxicam  7.5mg  and Gabapentin 100mg  once both at bedtime last night) for the symptoms. The treatment provided no relief.  Saw her PCP yesterday, virtually.  Had x-ray, still pending.    Problems:  Patient Active Problem List   Diagnosis Date Noted   Family history of pancreatic cancer 12/21/2019   Family history of breast cancer 12/21/2019   Constipation 07/17/2018   Anemia    Hypertension associated with diabetes (HCC)    Epigastric abdominal  pain 05/10/2017   Vitamin D  deficiency 06/19/2015   Obesity 06/19/2015   Hyperlipidemia associated with type 2 diabetes mellitus (HCC) 06/19/2015   GERD (gastroesophageal reflux disease) 06/19/2015   Diabetes mellitus, type 2 (HCC) 03/25/2015    Allergies:  Allergies  Allergen Reactions   Elemental Sulfur Hives   Other Other (See Comments)   Penicillins Hives and Swelling   Medications:   Current Outpatient Medications:    methylPREDNISolone  (MEDROL  DOSEPAK) 4 MG TBPK tablet, 6 day taper; take as directed on package instructions, Disp: 21 tablet, Rfl: 0   baclofen  (LIORESAL ) 10 MG tablet, Take 1 tablet (10 mg total) by mouth 3 (three) times daily., Disp: 30 each, Rfl: 0   Blood Glucose Monitoring Suppl (ACCU-CHEK AVIVA PLUS) w/Device KIT, Use to check blood sugar fasting daily and as needed, Disp: 1 kit, Rfl: 0   Cholecalciferol (VITAMIN D ) 2000 UNITS CAPS, Take 2,000 Units by mouth daily., Disp: , Rfl:    gabapentin  (NEURONTIN ) 100 MG capsule, Take 1 capsule (100 mg total) by mouth 3 (three) times daily as needed., Disp: , Rfl:    glucose blood (ACCU-CHEK AVIVA PLUS) test strip, Use to check blood sugar fasting daily and as needed, Disp: 100 each, Rfl: 3   JANUVIA  100 MG tablet, Take 1 tablet by mouth once daily, Disp: 30 tablet, Rfl: 0   Lancets (ACCU-CHEK MULTICLIX) lancets, Use to check blood sugar fasting daily and as needed, Disp: 100 each, Rfl: 3   losartan  (COZAAR ) 25 MG tablet, Take 1 tablet (25 mg total) by mouth daily., Disp: 90 tablet, Rfl: 3   meloxicam  (MOBIC ) 7.5 MG tablet, Take 1 tablet (7.5 mg total) by mouth daily., Disp: 30 tablet, Rfl: 0   metFORMIN  (GLUCOPHAGE ) 1000 MG tablet, Take 1 tablet (1,000 mg total) by mouth daily with breakfast., Disp: , Rfl:    Multiple Vitamins-Minerals (WOMENS MULTIVITAMIN PLUS PO), Take 1 tablet by mouth daily., Disp: , Rfl:    simvastatin  (ZOCOR ) 20 MG tablet, Take 1 tablet (20 mg total) by mouth at bedtime., Disp: 90 tablet, Rfl: 3   Vitamin D , Ergocalciferol , (DRISDOL ) 1.25 MG (50000 UNIT) CAPS capsule, Take 1 capsule (50,000 Units total) by mouth every 7 (seven) days., Disp: 12 capsule, Rfl: 1  Observations/Objective: Patient is well-developed, well-nourished in no acute distress.  Resting comfortably at home.  Head is normocephalic, atraumatic.  No labored breathing.  Speech is clear and coherent with logical content.   Patient is alert and oriented at baseline.    Assessment and Plan: 1. Left arm pain (Primary) - methylPREDNISolone  (MEDROL  DOSEPAK) 4 MG TBPK tablet; 6 day taper; take as directed on package instructions  Dispense: 21 tablet; Refill: 0 - gabapentin  (NEURONTIN ) 100 MG capsule; Take 1 capsule (100 mg total) by mouth 3 (three) times daily as needed.  2. Neuropathic pain of left shoulder - gabapentin  (NEURONTIN ) 100 MG capsule; Take 1 capsule (100 mg total) by mouth 3 (three) times daily as needed.  - Worsening - Suspect pinched nerve with radiculopathy, possible trigger point or muscle issue causing as it is coming from the medial shoulder blade up the arm - Failed NSAIDs - Add Medrol  dose pack (Monitor Glucose closely) - Avoid NSAIDs (Ibuprofen/Advil/Motrin or Naproxen /Aleve ) while on steroid - Tylenol  if okay for breakthrough pain - Heat to area - Shoulder exercises and stretches provided via AVS - Seek in person evaluation if worsening or fails to improve with treatment   Follow Up Instructions: I discussed the assessment and treatment plan  with the patient. The patient was provided an opportunity to ask questions and all were answered. The patient agreed with the plan and demonstrated an understanding of the instructions.  A copy of instructions were sent to the patient via MyChart unless otherwise noted below.    The patient was advised to call back or seek an in-person evaluation if the symptoms worsen or if the condition fails to improve as anticipated.    Jacqueline CHRISTELLA Dickinson, PA-C

## 2024-09-07 ENCOUNTER — Emergency Department (HOSPITAL_COMMUNITY)

## 2024-09-07 ENCOUNTER — Encounter (HOSPITAL_COMMUNITY): Payer: Self-pay

## 2024-09-07 ENCOUNTER — Other Ambulatory Visit: Payer: Self-pay

## 2024-09-07 ENCOUNTER — Emergency Department (HOSPITAL_COMMUNITY)
Admission: EM | Admit: 2024-09-07 | Discharge: 2024-09-07 | Disposition: A | Discharge status: Home / Self Care | Attending: Emergency Medicine | Admitting: Emergency Medicine

## 2024-09-07 DIAGNOSIS — M25512 Pain in left shoulder: Secondary | ICD-10-CM | POA: Diagnosis present

## 2024-09-07 DIAGNOSIS — Y92410 Unspecified street and highway as the place of occurrence of the external cause: Secondary | ICD-10-CM | POA: Diagnosis not present

## 2024-09-07 DIAGNOSIS — M5412 Radiculopathy, cervical region: Secondary | ICD-10-CM | POA: Insufficient documentation

## 2024-09-07 MED ORDER — HYDROCODONE-ACETAMINOPHEN 5-325 MG PO TABS: 1.0000 | ORAL_TABLET | Freq: Four times a day (QID) | ORAL | 0 refills | Status: AC | PRN
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
## 2024-09-07 NOTE — ED Notes (Signed)
 ED Provider at bedside.

## 2024-09-07 NOTE — Discharge Instructions (Addendum)
 Your workup showed possible disc bulge in your neck which may account for some of your shoulder pain.  Please continue steroids.  We are putting you on some narcotic pain medicine to help with pain.  Follow-up with your primary care doctor as she may require MRI or physical therapy.  Return if any worsening or concerning symptoms
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
## 2024-09-08 ENCOUNTER — Encounter: Payer: Self-pay | Admitting: Family Medicine

## 2024-09-09 ENCOUNTER — Telehealth: Admitting: Physician Assistant

## 2024-09-09 DIAGNOSIS — M79602 Pain in left arm: Secondary | ICD-10-CM

## 2024-09-09 DIAGNOSIS — M542 Cervicalgia: Secondary | ICD-10-CM

## 2024-09-09 NOTE — Patient Instructions (Addendum)
 Report to ER for evaluation
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
## 2024-09-11 ENCOUNTER — Ambulatory Visit: Admitting: Family Medicine

## 2024-09-11 ENCOUNTER — Ambulatory Visit: Payer: Self-pay | Admitting: Family Medicine

## 2024-09-11 ENCOUNTER — Encounter: Payer: Self-pay | Admitting: Family Medicine

## 2024-09-11 VITALS — BP 146/78 | HR 71 | Ht 66.0 in | Wt 172.5 lb

## 2024-09-11 DIAGNOSIS — M5412 Radiculopathy, cervical region: Secondary | ICD-10-CM

## 2024-09-11 DIAGNOSIS — M542 Cervicalgia: Secondary | ICD-10-CM

## 2024-09-11 DIAGNOSIS — M79602 Pain in left arm: Secondary | ICD-10-CM

## 2024-09-11 DIAGNOSIS — M5030 Other cervical disc degeneration, unspecified cervical region: Secondary | ICD-10-CM

## 2024-09-11 NOTE — Progress Notes (Signed)
 ACUTE VISIT   Patient: Jacqueline Romero   DOB: October 10, 1966   58 y.o. Female  MRN: [id number]   PCP: Sharma Coyer, MD  Chief Complaint  Patient presents with   Shoulder Pain    Patient is present with left shoulder pain x one week requesting MRI order. Has been seen for virtual and ER visit, recommended MRI because they believe it could be bulging disc in neck.    Subjective    HPI HPI     Shoulder Pain    Additional comments: Patient is present with left shoulder pain x one week requesting MRI order. Has been seen for virtual and ER visit, recommended MRI because they believe it could be bulging disc in neck.       Last edited by Cherry Chiquita HERO, CMA on 09/11/2024  3:35 PM.       Discussed the use of AI scribe software for clinical note transcription with the patient, who gave verbal consent to proceed.  History of Present Illness Jacqueline Romero is a 58 year old female who presents with left shoulder pain for one week.  She has been experiencing left shoulder pain for the past week, originating in the top part of her shoulder and radiating down her arm. The pain worsens with movement, especially when turning or moving her arm, and is somewhat relieved by holding her arm in a specific position. Sleeping with her hand on a pillow provides some relief, but any movement during sleep causes significant discomfort.  A CT cervical spine performed four days ago revealed degenerative changes with disc bulge and posterior interbody spurring at C5 through C6 and C6 through C7 without impingement. A CT of her left shoulder from the emergency department showed mild glenohumeral and AC joint osteoarthritis but no acute abnormalities. A CT of her chest showed no traumatic injury.  She has tried several medications for pain relief, including Norco (5 mg hydrocodone ), gabapentin  (100 mg), a Medrol  Dosepak, meloxicam , and baclofen . Norco made her sleepy and sick,  gabapentin  also caused sleepiness, and the Medrol  Dosepak did not provide noticeable relief. She has not been using meloxicam . BC arthritis powder provided slight relief but did not resolve the pain.  The pain has not improved despite rest and medication, significantly impacting her ability to work, as she has been mostly sitting at work due to the discomfort. Attempts to walk around to alleviate the pain have been unsuccessful. She states, 'I can't take it.'     Medications: Outpatient Medications Prior to Visit  Medication Sig   baclofen  (LIORESAL ) 10 MG tablet Take 1 tablet (10 mg total) by mouth 3 (three) times daily.   Blood Glucose Monitoring Suppl (ACCU-CHEK AVIVA PLUS) w/Device KIT Use to check blood sugar fasting daily and as needed   Cholecalciferol (VITAMIN D ) 2000 UNITS CAPS Take 2,000 Units by mouth daily.   gabapentin  (NEURONTIN ) 100 MG capsule Take 1 capsule (100 mg total) by mouth 3 (three) times daily as needed.   glucose blood (ACCU-CHEK AVIVA PLUS) test strip Use to check blood sugar fasting daily and as needed   HYDROcodone -acetaminophen  (NORCO/VICODIN) 5-325 MG tablet Take 1 tablet by mouth every 6 (six) hours as needed.   JANUVIA  100 MG tablet Take 1 tablet by mouth once daily   Lancets (ACCU-CHEK MULTICLIX) lancets Use to check blood sugar fasting daily and as needed   losartan  (COZAAR ) 25 MG tablet Take 1 tablet (25 mg total) by mouth daily.  meloxicam  (MOBIC ) 7.5 MG tablet Take 1 tablet (7.5 mg total) by mouth daily.   metFORMIN  (GLUCOPHAGE ) 1000 MG tablet Take 1 tablet (1,000 mg total) by mouth daily with breakfast.   methylPREDNISolone  (MEDROL  DOSEPAK) 4 MG TBPK tablet 6 day taper; take as directed on package instructions   Multiple Vitamins-Minerals (WOMENS MULTIVITAMIN PLUS PO) Take 1 tablet by mouth daily.   simvastatin  (ZOCOR ) 20 MG tablet Take 1 tablet (20 mg total) by mouth at bedtime.   Vitamin D , Ergocalciferol , (DRISDOL ) 1.25 MG (50000 UNIT) CAPS capsule  Take 1 capsule (50,000 Units total) by mouth every 7 (seven) days.   No facility-administered medications prior to visit.        Objective    BP (!) 146/78 (BP Location: Right Arm, Patient Position: Sitting) Comment: Manual  Pulse 71   Ht 5' 6 (1.676 m)   Wt 172 lb 8 oz (78.2 kg)   SpO2 100%   BMI 27.84 kg/m    Physical Exam   Physical Exam MUSCULOSKELETAL: Tenderness over the left posterior trapezius muscle. Pain with neck rotation to the left and touching the left ear to the left shoulder. No pain with neck rotation to the right or touching the right ear to the right shoulder. 5/5 strength against resistance with external rotation. No significant tenderness over the acromioclavicular joint.   No results found for any visits on 09/11/24.  Assessment & Plan     Assessment and Plan Assessment & Plan Left shoulder pain with cervical radiculopathy symptoms and cervical degenerative disc disease (C5-C7) Acute problem, worsening  Left shoulder pain for one week with cervical radiculopathy symptoms, including pain radiating from the neck to the shoulder and arm. CT cervical spine shows degenerative changes with disc bulge and posterior interbody spurring at C5-C6 and C6-C7 without impingement. Symptoms suggest possible nerve impingement due to disc bulging. Current medications (Norco, gabapentin , Medrol  Dosepak) have not provided relief. Gabapentin  and Norco caused drowsiness without significant pain relief. Urgent referral to neurosurgery is warranted for further evaluation and management, including potential epidural injections or surgical intervention. - Referred to neurosurgery for urgent evaluation and management of cervical radiculopathy and potential nerve impingement.  Left shoulder osteoarthritis CT of the left shoulder shows mild glenohumeral and AC joint osteoarthritis. Symptoms include tenderness over the left trapezius muscle and significant pain with neck rotation and  certain shoulder movements. No significant tenderness over the [facility] joint. Current management with meloxicam  was not pursued due to concurrent steroid use.      No follow-ups on file.        Rockie Agent, MD  [facility] [phone] (phone) [phone] (fax)  [facility]
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
## 2024-09-18 ENCOUNTER — Ambulatory Visit: Admitting: Neurosurgery

## 2024-09-18 ENCOUNTER — Ambulatory Visit

## 2024-09-18 ENCOUNTER — Telehealth: Payer: Self-pay | Admitting: Neurosurgery

## 2024-09-18 ENCOUNTER — Encounter: Payer: Self-pay | Admitting: Neurosurgery

## 2024-09-18 VITALS — BP 148/92 | Ht 66.0 in | Wt 172.0 lb

## 2024-09-18 DIAGNOSIS — M542 Cervicalgia: Secondary | ICD-10-CM | POA: Diagnosis not present

## 2024-09-18 DIAGNOSIS — M25511 Pain in right shoulder: Secondary | ICD-10-CM | POA: Diagnosis not present

## 2024-09-18 DIAGNOSIS — S46009A Unspecified injury of muscle(s) and tendon(s) of the rotator cuff of unspecified shoulder, initial encounter: Secondary | ICD-10-CM

## 2024-09-18 NOTE — Telephone Encounter (Signed)
 Patient stopped by the front desk on her way out and wanted to be sure that the medication Dr. Claudene was sending in was sent to [facility] in [place].

## 2024-09-18 NOTE — Telephone Encounter (Signed)
 Patient notified and expressed understanding.

## 2024-09-19 ENCOUNTER — Encounter: Payer: Self-pay | Admitting: Neurosurgery

## 2024-09-19 NOTE — Telephone Encounter (Signed)
 LOV 09/18/2024-or for work note for yesterday and today?  Any other suggestions for pain?

## 2024-09-21 ENCOUNTER — Ambulatory Visit

## 2024-09-21 DIAGNOSIS — M25512 Pain in left shoulder: Secondary | ICD-10-CM

## 2024-09-21 DIAGNOSIS — M542 Cervicalgia: Secondary | ICD-10-CM | POA: Diagnosis not present

## 2024-09-21 DIAGNOSIS — S46812A Strain of other muscles, fascia and tendons at shoulder and upper arm level, left arm, initial encounter: Secondary | ICD-10-CM | POA: Diagnosis not present

## 2024-09-21 NOTE — Patient Instructions (Signed)
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
## 2024-09-25 ENCOUNTER — Encounter: Payer: Self-pay | Admitting: Neurosurgery

## 2024-09-26 ENCOUNTER — Ambulatory Visit: Payer: Self-pay | Admitting: Neurosurgery

## 2024-09-26 ENCOUNTER — Encounter: Payer: Self-pay | Admitting: Family Medicine

## 2024-09-27 ENCOUNTER — Telehealth: Payer: Self-pay

## 2024-09-27 NOTE — Telephone Encounter (Signed)
 Spoke with pt and advise she will need to contact [facility] on [address] to get cd or pictures. She is needing for insurance purposes. Pt stated she called them yesterday and she was advised she needed to contact us , I advised we do not burn images onto CD here it would have to be them. Dr. Lang signed off on xray on 09/11/24 and pt viewed results 09/26/24. Pt stated she will try them again.

## 2024-09-27 NOTE — Telephone Encounter (Signed)
 Copied from CRM #[id number]. Topic: Clinical - Lab/Test Results >> Sep 27, 2024  3:56 PM Everette C wrote: Reason for CRM: The patient has called to request a copy of their left shoulder xray from 09/05/24. Please contact the patient further if/when possible

## 2024-09-29 ENCOUNTER — Other Ambulatory Visit: Payer: Self-pay | Admitting: Family Medicine

## 2024-09-29 DIAGNOSIS — M792 Neuralgia and neuritis, unspecified: Secondary | ICD-10-CM

## 2024-10-10 ENCOUNTER — Encounter: Payer: Self-pay | Admitting: Family Medicine

## 2024-10-10 ENCOUNTER — Ambulatory Visit: Admitting: Family Medicine

## 2024-10-10 VITALS — BP 155/87 | HR 82 | Ht 66.0 in | Wt 172.9 lb

## 2024-10-10 DIAGNOSIS — I152 Hypertension secondary to endocrine disorders: Secondary | ICD-10-CM

## 2024-10-10 DIAGNOSIS — M542 Cervicalgia: Secondary | ICD-10-CM

## 2024-10-10 DIAGNOSIS — Z7984 Long term (current) use of oral hypoglycemic drugs: Secondary | ICD-10-CM | POA: Diagnosis not present

## 2024-10-10 DIAGNOSIS — E1159 Type 2 diabetes mellitus with other circulatory complications: Secondary | ICD-10-CM

## 2024-10-10 DIAGNOSIS — E119 Type 2 diabetes mellitus without complications: Secondary | ICD-10-CM

## 2024-10-10 DIAGNOSIS — M792 Neuralgia and neuritis, unspecified: Secondary | ICD-10-CM

## 2024-10-10 DIAGNOSIS — M5416 Radiculopathy, lumbar region: Secondary | ICD-10-CM | POA: Diagnosis not present

## 2024-10-10 DIAGNOSIS — E559 Vitamin D deficiency, unspecified: Secondary | ICD-10-CM | POA: Diagnosis not present

## 2024-10-10 DIAGNOSIS — E785 Hyperlipidemia, unspecified: Secondary | ICD-10-CM

## 2024-10-10 DIAGNOSIS — E1169 Type 2 diabetes mellitus with other specified complication: Secondary | ICD-10-CM | POA: Diagnosis not present

## 2024-10-10 MED ORDER — KETOROLAC TROMETHAMINE 60 MG/2ML IM SOLN
60.0000 mg | Freq: Once | INTRAMUSCULAR | Status: AC
  Administered 2024-10-10: 30 mg via INTRAMUSCULAR

## 2024-10-10 MED ORDER — BACLOFEN 10 MG PO TABS: 10.0000 mg | ORAL_TABLET | Freq: Three times a day (TID) | ORAL | 0 refills | Status: AC

## 2024-10-10 MED ORDER — PREGABALIN 75 MG PO CAPS: 75.0000 mg | ORAL_CAPSULE | Freq: Two times a day (BID) | ORAL | 0 refills | Status: AC

## 2024-10-10 MED ORDER — CYCLOBENZAPRINE HCL 10 MG PO TABS: 10.0000 mg | ORAL_TABLET | Freq: Every day | ORAL | 0 refills | Status: AC

## 2024-10-10 MED ORDER — MELOXICAM 15 MG PO TABS: 15.0000 mg | ORAL_TABLET | Freq: Every day | ORAL | 0 refills | Status: AC

## 2024-10-10 NOTE — Patient Instructions (Signed)
 To keep you healthy, please keep in mind the following health maintenance items that you are due for:   Health Maintenance Due  Topic Date Due   OPHTHALMOLOGY EXAM  10/07/2024     Best Wishes,   Dr. Lang

## 2024-10-10 NOTE — Assessment & Plan Note (Signed)
 Vitamin D  deficiency Chronic vitamin D  deficiency with last level less than 20 ng/mL. Currently on vitamin D  50,000 units weekly. - Continue vitamin D  50,000 units weekly. - Ordered vitamin D  level to assess current status.

## 2024-10-10 NOTE — Assessment & Plan Note (Signed)
 Chronic  BP elevated in office today likely 2/2 to shoulder and left sided neck pain  Recommend that she continue to monitor BP as we work to treat neck pain  CMP ordered  Continue losartan , increase from 25mg  to 50mg  daily

## 2024-10-10 NOTE — Assessment & Plan Note (Signed)
" °  Type 2 diabetes mellitus with hypertension and hyperlipidemia Chronic condition  Type 2 diabetes mellitus with recent blood glucose levels elevated to 160 mg/dL, previously around 864-859 mg/dL. Last A1c was 7.7%. Hypertension with current blood pressure at 150/84 mmHg. Hyperlipidemia with LDL previously at 113 mg/dL, goal is under 55 mg/dL. Current medications include Januvia , metformin , losartan , and simvastatin . Dietary modifications discussed to improve cholesterol levels. - Ordered metabolic panel to assess kidney, liver, and electrolytes. - Ordered A1c to monitor diabetes control. - Ordered cholesterol panel to assess lipid levels. - Provided Mediterranean diet handout for dietary modifications. - Continue current medications: Januvia  100 mg daily, metformin  1000 mg daily, losartan  25 mg daily, simvastatin  20 mg daily. "

## 2024-10-10 NOTE — Assessment & Plan Note (Signed)
 Chronic  Continue simvastatin  20mg  daily  Repeat lipid panel ordered today

## 2024-10-10 NOTE — Progress Notes (Signed)
 "  Established Patient Office Visit  Patient ID: Jacqueline Romero, female    DOB: [birth year]  Age: 58 y.o. MRN: [id number] PCP: Sharma Coyer, MD  Chief Complaint  Patient presents with   Medical Management of Chronic Issues    Patient is present for follow up of DM, neck pain  Neck pain still ongoing, otherwise good     Subjective:     HPI  Discussed the use of AI scribe software for clinical note transcription with the patient, who gave verbal consent to proceed.  History of Present Illness Jacqueline Romero is a 58 year old female with chronic hypertension and type 2 diabetes who presents for management of these conditions.  She has been experiencing elevated blood pressure readings, with the most recent being 150/84 mmHg. She is currently taking losartan  25 mg daily for hypertension but does not routinely monitor her blood pressure at home. Her blood pressure is usually stable.  For type 2 diabetes, she is on Januvia  100 mg daily and metformin  1000 mg daily. Her blood sugar levels were elevated to 160 mg/dL during a recent period of illness, whereas they are typically around 135-140 mg/dL in the mornings. She has not been checking her blood sugar regularly and attributes some dietary changes to her recent elevated readings.  She is managing hyperlipidemia with simvastatin  20 mg daily. Her last LDL cholesterol was 113 mg/dL. She wants to make dietary changes to help manage her cholesterol levels.  She has a chronic vitamin D  deficiency, for which she takes vitamin D  50,000 units weekly. Her last vitamin D  level was less than 20 ng/mL.  She reports significant neck and shoulder pain, which radiates from the neck down to the shoulder and is exacerbated by certain movements. She has tried various treatments, including physical therapy, x-rays, and medications such as gabapentin  and Norco, with limited relief. The pain is severe enough to impact her sleep and daily  activities, requiring her to use a neck support and take pain medication regularly. She has also used topical treatments like horse liniment for temporary relief.  She has a history of kidney stones, for which she received a Toradol  injection last December, which was effective for flank pain at that time.  She works at [employer] and often helps her son, which affects her meal times and dietary habits. She mentions her husband and son in the context of her daily life and dietary management.   Patient Active Problem List   Diagnosis Date Noted   Family history of pancreatic cancer 12/21/2019   Family history of breast cancer 12/21/2019   Constipation 07/17/2018   Anemia    Hypertension associated with diabetes (HCC)    Epigastric abdominal pain 05/10/2017   Vitamin D  deficiency 06/19/2015   Obesity 06/19/2015   Hyperlipidemia associated with type 2 diabetes mellitus (HCC) 06/19/2015   GERD (gastroesophageal reflux disease) 06/19/2015   Diabetes mellitus, type 2 (HCC) 03/25/2015      ROS    Objective:     BP (!) 155/87 (BP Location: Right Arm, Patient Position: Sitting, Cuff Size: Normal)   Pulse 82   Ht 5' 6 (1.676 m)   Wt 172 lb 14.4 oz (78.4 kg)   SpO2 100%   BMI 27.91 kg/m  BP Readings from Last 3 Encounters:  10/10/24 (!) 155/87  09/18/24 (!) 148/92  09/11/24 (!) 146/78   Wt Readings from Last 3 Encounters:  10/10/24 172 lb 14.4 oz (78.4 kg)  09/18/24 172 lb (  78 kg)  09/11/24 172 lb 8 oz (78.2 kg)      Physical Exam Vitals reviewed.  Constitutional:      General: She is not in acute distress.    Appearance: Normal appearance. She is not ill-appearing.  Cardiovascular:     Rate and Rhythm: Normal rate and regular rhythm.  Pulmonary:     Effort: Pulmonary effort is normal. No respiratory distress.     Breath sounds: No wheezing, rhonchi or rales.  Musculoskeletal:     Left shoulder: Tenderness present. Decreased range of motion.     Cervical back:  Tenderness present.     Thoracic back: Spasms and tenderness present.       Back:  Neurological:     Mental Status: She is alert and oriented to person, place, and time.  Psychiatric:        Mood and Affect: Mood normal.        Behavior: Behavior normal.      No results found for any visits on 10/10/24.  Last metabolic panel Lab Results  Component Value Date   GLUCOSE 123 (H) 07/11/2024   NA 145 (H) 07/11/2024   K 4.3 07/11/2024   CL 107 (H) 07/11/2024   CO2 24 07/11/2024   BUN 14 07/11/2024   CREATININE 0.87 07/11/2024   EGFR 77 07/11/2024   CALCIUM  9.3 07/11/2024   PROT 6.7 07/11/2024   ALBUMIN 4.5 07/11/2024   LABGLOB 2.2 07/11/2024   AGRATIO 1.7 03/31/2022   BILITOT 0.3 07/11/2024   ALKPHOS 67 07/11/2024   AST 13 07/11/2024   ALT 8 07/11/2024   ANIONGAP 9 09/20/2023   Last lipids Lab Results  Component Value Date   CHOL 190 04/05/2023   HDL 64 04/05/2023   LDLCALC 113 (H) 04/05/2023   TRIG 69 04/05/2023   CHOLHDL 3.0 04/05/2023  The 10-year ASCVD risk score (Arnett DK, et al., 2019) is: 21.7%  Last hemoglobin A1c Lab Results  Component Value Date   HGBA1C 7.7 (H) 07/11/2024      The 10-year ASCVD risk score (Arnett DK, et al., 2019) is: 21.7%    Assessment & Plan:   Problem List Items Addressed This Visit     Diabetes mellitus, type 2 (HCC) - Primary    Type 2 diabetes mellitus with hypertension and hyperlipidemia Chronic condition  Type 2 diabetes mellitus with recent blood glucose levels elevated to 160 mg/dL, previously around 864-859 mg/dL. Last A1c was 7.7%. Hypertension with current blood pressure at 150/84 mmHg. Hyperlipidemia with LDL previously at 113 mg/dL, goal is under 55 mg/dL. Current medications include Januvia , metformin , losartan , and simvastatin . Dietary modifications discussed to improve cholesterol levels. - Ordered metabolic panel to assess kidney, liver, and electrolytes. - Ordered A1c to monitor diabetes control. - Ordered  cholesterol panel to assess lipid levels. - Provided Mediterranean diet handout for dietary modifications. - Continue current medications: Januvia  100 mg daily, metformin  1000 mg daily, losartan  25 mg daily, simvastatin  20 mg daily.      Relevant Orders   HgB A1c   Comprehensive metabolic panel with GFR   Hyperlipidemia associated with type 2 diabetes mellitus (HCC)   Chronic  Continue simvastatin  20mg  daily  Repeat lipid panel ordered today       Relevant Orders   Lipid panel   Hypertension associated with diabetes (HCC)   Chronic  BP elevated in office today likely 2/2 to shoulder and left sided neck pain  Recommend that she continue to monitor BP as we  work to treat neck pain  CMP ordered  Continue losartan , increase from 25mg  to 50mg  daily       Relevant Orders   Comprehensive metabolic panel with GFR   Vitamin D  deficiency   Vitamin D  deficiency Chronic vitamin D  deficiency with last level less than 20 ng/mL. Currently on vitamin D  50,000 units weekly. - Continue vitamin D  50,000 units weekly. - Ordered vitamin D  level to assess current status.      Relevant Orders   Vitamin D  (25 hydroxy)   Other Visit Diagnoses       Lumbar back pain with radiculopathy affecting right lower extremity       Relevant Medications   baclofen  (LIORESAL ) 10 MG tablet   ketorolac  (TORADOL ) injection 60 mg (Completed)   meloxicam  (MOBIC ) 15 MG tablet   cyclobenzaprine  (FLEXERIL ) 10 MG tablet   pregabalin  (LYRICA ) 75 MG capsule     Neuropathic pain of left shoulder       Relevant Medications   ketorolac  (TORADOL ) injection 60 mg (Completed)   meloxicam  (MOBIC ) 15 MG tablet   cyclobenzaprine  (FLEXERIL ) 10 MG tablet   pregabalin  (LYRICA ) 75 MG capsule     Neck pain       Relevant Medications   ketorolac  (TORADOL ) injection 60 mg (Completed)   meloxicam  (MOBIC ) 15 MG tablet   cyclobenzaprine  (FLEXERIL ) 10 MG tablet   pregabalin  (LYRICA ) 75 MG capsule       Assessment and  Plan Assessment & Plan Neck and left shoulder pain with neuropathic features 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 Pain is severe, radiating, and exacerbated by movement. Previous imaging showed similar findings to earlier x-rays. Gabapentin  was ineffective and caused drowsiness. Physical therapy is scheduled but not yet started. Muscle spasms present in trapezius muscles. - Administered 30 mg Toradol  injection for pain relief. - Started Lyrica  75 mg twice daily for neuropathic pain. - Increased meloxicam  to 15 mg daily for pain management. - Started Flexeril  10 mg at bedtime as needed for muscle relaxation. - Discontinued gabapentin .  - Monitor blood pressure at home; if consistently above 140/90 mmHg, will increase losartan  to 50 mg daily.      Return in about 3 months (around 01/08/2025) for HTN, DM.    Rockie Agent, MD [facility]   "

## 2024-10-11 LAB — COMPREHENSIVE METABOLIC PANEL WITH GFR
ALT: 8 IU/L (ref 0–32)
AST: 12 IU/L (ref 0–40)
Albumin: 4.5 g/dL (ref 3.8–4.9)
Alkaline Phosphatase: 66 IU/L (ref 49–135)
BUN/Creatinine Ratio: 18 (ref 9–23)
BUN: 16 mg/dL (ref 6–24)
Bilirubin Total: 0.5 mg/dL (ref 0.0–1.2)
CO2: 23 mmol/L (ref 20–29)
Calcium: 9.4 mg/dL (ref 8.7–10.2)
Chloride: 105 mmol/L (ref 96–106)
Creatinine, Ser: 0.91 mg/dL (ref 0.57–1.00)
Globulin, Total: 2.2 g/dL (ref 1.5–4.5)
Glucose: 145 mg/dL — ABNORMAL HIGH (ref 70–99)
Potassium: 4.3 mmol/L (ref 3.5–5.2)
Sodium: 146 mmol/L — ABNORMAL HIGH (ref 134–144)
Total Protein: 6.7 g/dL (ref 6.0–8.5)
eGFR: 73 mL/min/1.73

## 2024-10-11 LAB — VITAMIN D 25 HYDROXY (VIT D DEFICIENCY, FRACTURES): Vit D, 25-Hydroxy: 31.4 ng/mL (ref 30.0–100.0)

## 2024-10-11 LAB — HEMOGLOBIN A1C
Est. average glucose Bld gHb Est-mCnc: 183 mg/dL
Hgb A1c MFr Bld: 8 % — ABNORMAL HIGH (ref 4.8–5.6)

## 2024-10-11 LAB — LIPID PANEL
Chol/HDL Ratio: 3.1 ratio (ref 0.0–4.4)
Cholesterol, Total: 218 mg/dL — ABNORMAL HIGH (ref 100–199)
HDL: 70 mg/dL
LDL Chol Calc (NIH): 134 mg/dL — ABNORMAL HIGH (ref 0–99)
Triglycerides: 77 mg/dL (ref 0–149)
VLDL Cholesterol Cal: 14 mg/dL (ref 5–40)

## 2024-10-16 ENCOUNTER — Ambulatory Visit: Payer: Self-pay | Admitting: Family Medicine

## 2024-10-16 DIAGNOSIS — E870 Hyperosmolality and hypernatremia: Secondary | ICD-10-CM

## 2024-11-16 ENCOUNTER — Ambulatory Visit
